# Patient Record
Sex: Female | Born: 1960 | Race: White | Hispanic: No | Marital: Married | Smoking: Former smoker
Health system: Southern US, Community
[De-identification: ages and names within clinical notes are randomized; demographics above are authoritative.]

## PROBLEM LIST (undated history)

## (undated) DIAGNOSIS — E7800 Pure hypercholesterolemia, unspecified: Secondary | ICD-10-CM

## (undated) HISTORY — PX: OTHER SURGICAL HISTORY: SHX169

---

## 2000-04-13 ENCOUNTER — Encounter: Payer: Self-pay | Admitting: Family Medicine

## 2000-05-24 ENCOUNTER — Encounter: Payer: Self-pay | Admitting: Family Medicine

## 2000-07-20 ENCOUNTER — Encounter: Payer: Self-pay | Admitting: Family Medicine

## 2001-05-21 ENCOUNTER — Encounter: Payer: Self-pay | Admitting: Family Medicine

## 2004-02-17 ENCOUNTER — Encounter: Payer: Self-pay | Admitting: Family Medicine

## 2004-06-25 ENCOUNTER — Encounter: Payer: Self-pay | Admitting: Family Medicine

## 2004-10-14 ENCOUNTER — Encounter: Payer: Self-pay | Admitting: Family Medicine

## 2004-10-26 ENCOUNTER — Encounter: Payer: Self-pay | Admitting: Family Medicine

## 2005-01-07 ENCOUNTER — Encounter: Payer: Self-pay | Admitting: Family Medicine

## 2005-05-29 ENCOUNTER — Encounter: Payer: Self-pay | Admitting: Family Medicine

## 2005-06-07 ENCOUNTER — Encounter: Payer: Self-pay | Admitting: Family Medicine

## 2007-10-08 ENCOUNTER — Ambulatory Visit: Payer: Self-pay | Admitting: Family Medicine

## 2007-10-08 DIAGNOSIS — R079 Chest pain, unspecified: Secondary | ICD-10-CM | POA: Insufficient documentation

## 2007-10-08 DIAGNOSIS — K219 Gastro-esophageal reflux disease without esophagitis: Secondary | ICD-10-CM | POA: Insufficient documentation

## 2007-10-15 ENCOUNTER — Encounter: Payer: Self-pay | Admitting: Family Medicine

## 2007-10-16 LAB — CONVERTED CEMR LAB
Albumin: 4.2 g/dL (ref 3.5–5.2)
CO2: 22 meq/L (ref 19–32)
Cholesterol, target level: 200 mg/dL
Cholesterol: 204 mg/dL — ABNORMAL HIGH (ref 0–200)
Glucose, Bld: 86 mg/dL (ref 70–99)
LDL Goal: 160 mg/dL
Potassium: 4 meq/L (ref 3.5–5.3)
Sodium: 141 meq/L (ref 135–145)
Total Protein: 6.3 g/dL (ref 6.0–8.3)
Triglycerides: 195 mg/dL — ABNORMAL HIGH (ref <150)

## 2007-11-20 ENCOUNTER — Encounter: Payer: Self-pay | Admitting: Family Medicine

## 2007-12-26 ENCOUNTER — Encounter: Payer: Self-pay | Admitting: Family Medicine

## 2008-06-18 ENCOUNTER — Ambulatory Visit: Payer: Self-pay | Admitting: Family Medicine

## 2008-06-18 LAB — CONVERTED CEMR LAB
Glucose, Urine, Semiquant: NEGATIVE
Ketones, urine, test strip: NEGATIVE
Urobilinogen, UA: 0.2

## 2008-06-19 ENCOUNTER — Encounter: Payer: Self-pay | Admitting: Family Medicine

## 2009-03-13 ENCOUNTER — Ambulatory Visit: Payer: Self-pay | Admitting: Family Medicine

## 2009-04-13 ENCOUNTER — Ambulatory Visit: Payer: Self-pay | Admitting: Family Medicine

## 2009-04-13 DIAGNOSIS — E041 Nontoxic single thyroid nodule: Secondary | ICD-10-CM | POA: Insufficient documentation

## 2009-04-14 ENCOUNTER — Encounter: Admission: RE | Admit: 2009-04-14 | Discharge: 2009-04-14 | Payer: Self-pay | Admitting: Family Medicine

## 2009-04-14 LAB — CONVERTED CEMR LAB
Basophils Absolute: 0 10*3/uL (ref 0.0–0.1)
Free T4: 1.25 ng/dL (ref 0.80–1.80)
Hemoglobin: 14.9 g/dL (ref 12.0–15.0)
Lymphocytes Relative: 26 % (ref 12–46)
Monocytes Absolute: 0.4 10*3/uL (ref 0.1–1.0)
Monocytes Relative: 7 % (ref 3–12)
Neutro Abs: 4.2 10*3/uL (ref 1.7–7.7)
RBC: 4.78 M/uL (ref 3.87–5.11)
RDW: 13.2 % (ref 11.5–15.5)
TSH: 0.539 microintl units/mL (ref 0.350–4.500)

## 2009-04-30 ENCOUNTER — Encounter: Payer: Self-pay | Admitting: Family Medicine

## 2009-05-11 ENCOUNTER — Telehealth: Payer: Self-pay | Admitting: Family Medicine

## 2009-05-13 ENCOUNTER — Ambulatory Visit: Payer: Self-pay | Admitting: Family Medicine

## 2009-05-13 DIAGNOSIS — L03319 Cellulitis of trunk, unspecified: Secondary | ICD-10-CM

## 2009-05-13 DIAGNOSIS — L02219 Cutaneous abscess of trunk, unspecified: Secondary | ICD-10-CM | POA: Insufficient documentation

## 2009-07-09 ENCOUNTER — Telehealth: Payer: Self-pay | Admitting: Family Medicine

## 2009-08-13 ENCOUNTER — Telehealth (INDEPENDENT_AMBULATORY_CARE_PROVIDER_SITE_OTHER): Payer: Self-pay | Admitting: *Deleted

## 2009-10-01 ENCOUNTER — Ambulatory Visit: Payer: Self-pay | Admitting: Family Medicine

## 2009-10-01 DIAGNOSIS — R5381 Other malaise: Secondary | ICD-10-CM | POA: Insufficient documentation

## 2009-10-01 DIAGNOSIS — N390 Urinary tract infection, site not specified: Secondary | ICD-10-CM | POA: Insufficient documentation

## 2009-10-01 DIAGNOSIS — F3289 Other specified depressive episodes: Secondary | ICD-10-CM | POA: Insufficient documentation

## 2009-10-01 DIAGNOSIS — R5383 Other fatigue: Secondary | ICD-10-CM

## 2009-10-01 DIAGNOSIS — F329 Major depressive disorder, single episode, unspecified: Secondary | ICD-10-CM | POA: Insufficient documentation

## 2009-10-01 DIAGNOSIS — F320 Major depressive disorder, single episode, mild: Secondary | ICD-10-CM | POA: Insufficient documentation

## 2009-10-01 LAB — CONVERTED CEMR LAB
Specific Gravity, Urine: >=1.03
Urobilinogen, UA: 0.2
pH: 6

## 2009-10-04 LAB — CONVERTED CEMR LAB
BUN: 12 mg/dL (ref 6–23)
CO2: 24 meq/L (ref 19–32)
Calcium: 9.1 mg/dL (ref 8.4–10.5)
Chloride: 106 meq/L (ref 96–112)
Creatinine, Ser: 0.75 mg/dL (ref 0.40–1.20)
Glucose, Bld: 82 mg/dL (ref 70–99)
HCT: 44.7 % (ref 36.0–46.0)
Hemoglobin: 15.4 g/dL — ABNORMAL HIGH (ref 12.0–15.0)
Iron: 129 ug/dL (ref 42–145)
MCV: 92.4 fL (ref 78.0–100.0)
RBC: 4.84 M/uL (ref 3.87–5.11)
TSH: 0.725 microintl units/mL (ref 0.350–4.500)
Total Bilirubin: 0.4 mg/dL (ref 0.3–1.2)
Vit D, 25-Hydroxy: 53 ng/mL (ref 30–89)
Vitamin B-12: 365 pg/mL (ref 211–911)
WBC: 7 10*3/uL (ref 4.0–10.5)

## 2009-10-12 ENCOUNTER — Encounter: Admission: RE | Admit: 2009-10-12 | Discharge: 2009-10-12 | Payer: Self-pay | Admitting: Family Medicine

## 2010-02-02 NOTE — Letter (Signed)
Summary: Depression Questionnaire  Depression Questionnaire   Imported By: Lanelle Bal 10/08/2009 09:26:32  _____________________________________________________________________  External Attachment:    Type:   Image     Comment:   External Document

## 2010-02-02 NOTE — Progress Notes (Signed)
Summary: Results  Phone Note Call from Patient Call back at Work Phone [phone]   Caller: Patient Call For: Nani Gasser MD Summary of Call: Wanting biopsy results Initial call taken by: Kathlene November,  May 11, 2009 12:42 PM  Follow-up for Phone Call        Called and [facility] that results are benign bur that I did have a question for the pathologist and he is supposed to call me back later today to clarify something.  Follow-up by: Nani Gasser MD,  May 11, 2009 12:57 PM

## 2010-02-02 NOTE — Progress Notes (Signed)
----   Converted from flag ---- ---- 08/13/2009 11:54 AM, Nani Gasser MD wrote: Call pt:Due for f/u US of thyroid. ------------------------------  1:11 08/13/09 pt has been notified

## 2010-02-02 NOTE — Assessment & Plan Note (Signed)
Summary: sinusitis   Vital Signs:  Patient profile:   50 year old female Height:      63.4 inches Weight:      144 pounds Temp:     98.6 degrees F oral Pulse rate:   65 / minute BP sitting:   97 / 59  (left arm) Cuff size:   regular  Vitals Entered By: Kaitlyn Knox (March 13, 2009 10:37 AM) CC: sinus drainage, dull H/A, left earache, congestion for 2 weeks now- been taking Sudafed with not much relief   Primary Care Provider:  Nani Gasser MD  CC:  sinus drainage, dull H/A, left earache, and congestion for 2 weeks now- been taking Sudafed with not much relief.  History of Present Illness: Kaitlyn Knox is a 50 year old woman here with sinus drainage, HA, ear pain, and congestion x10 days. She has had a mild cough and some sinus pressure (worse over teh left maxillary sxs). She has been taking Sudafed without much relief. Her ear pain has been waking her up at night. Advil has not helped. No fevers, chills, mild sore throat which has resolved, no muscle aches. Many of her coworkers have been sick with the flu.   Current Medications (verified): 1)  Femhrt Low Dose 0.5-2.5 Mg-Mcg Tabs (Norethindrone-Eth Estradiol) .... Take One Tablet By Mouth Once A Day 2)  Benadryl 25 Mg Caps (Diphenhydramine Hcl) .... Take One Capsule By Mouth At Bedtime  Allergies (verified): 1)  ! Pcn 2)  ! Cipro 3)  ! Bactrim  Comments:  Nurse/Medical Assistant: The patient's medications and allergies were reviewed with the patient and were updated in the Medication and Allergy Lists. Kaitlyn Knox (March 13, 2009 10:38 AM)  Physical Exam  General:  Pleasant well-appearing female in no acute distress.  Head:  Normocephalic and atraumatic without obvious abnormalities. No apparent alopecia or balding. Eyes:  PERRL. No corneal or conjunctival inflammation noted.  Ears:  Left TM slightly retracted with light reflex not visualized. No erythema or inflammation. Right TM normal with normal light reflex.    Nose:  Nasal turbinates slightly red. L maxillary tenderness to percussion.  Mouth:  Oropharynx clear with no lesions or exudate.  Neck:  No deformities, masses, or tenderness noted. Lungs:  Clear to auscultation bilaterally with no wheezes, rales, or rhonchi. Normal work of breathing.  Heart:  Regular rate and rhythm with normal S1 and S2. No murmur, rub, or gallop.  Pulses:  Radial pulses 2+ bilaterally.  Extremities:  No cyanosis, clubbing, or edema.  Skin:  no rashes.   Cervical Nodes:  No lymphadenopathy noted Psych:  Interacting appropriately with normal concentration and attention. Not anxious or depressed appearing.    Impression & Recommendations:  Problem # 1:  SINUSITIS - ACUTE-NOS (ICD-461.9) Likely viral URI that progressed over 10 days to acute bacterial sinusitis L>R. Pt allergic to penicillin, will treat with azythromycin. Follow up in one week if no improvement.   The following medications were removed from the medication list:    Cephalexin 500 Mg Caps (Cephalexin) .Marland Kitchen... Take 1 tablet by mouth two times a day for 5 days Her updated medication list for this problem includes:    Zithromax Z-pak 250 Mg Tabs (Azithromycin) .Marland Kitchen... Take as directed  Complete Medication List: 1)  Femhrt Low Dose 0.5-2.5 Mg-mcg Tabs (Norethindrone-eth estradiol) .... Take one tablet by mouth once a day 2)  Benadryl 25 Mg Caps (Diphenhydramine hcl) .... Take one capsule by mouth at bedtime 3)  Zithromax Z-pak 250 Mg  Tabs (Azithromycin) .... Take as directed Prescriptions: ZITHROMAX Z-PAK 250 MG TABS (AZITHROMYCIN) Take as directed  #1 pack x 0   Entered and Authorized by:   Nani Gasser MD   Signed by:   Nani Gasser MD on 03/13/2009   Method used:   Electronically to        [facility] [phone]* (retail)       [address]       [place], [place]  [address]       Ph: [phone]       Fax: [phone]   RxID:   [id number]

## 2010-02-02 NOTE — Assessment & Plan Note (Signed)
Summary: Back Pain, UTI, fatigue, etc   Vital Signs:  Patient profile:   50 year old female Height:      63.4 inches Weight:      144 pounds Pulse rate:   71 / minute BP sitting:   111 / 66  (right arm) Cuff size:   regular  Vitals Entered By: Avon Gully CMA, Duncan Dull) (October 01, 2009 8:15 AM) CC: lower back pain,chills hot flashes, concerned about UTI   Primary Care Feige Lowdermilk:  Nani Gasser MD  CC:  lower back pain, chills hot flashes, and concerned about UTI.  History of Present Illness: lower back pain,chills hot flashes, concerned about UTI.  has been under alot of stress since April.  Has had some back pain on and off since then. Mostly bilat low back and upper back between teh shoulder blades. 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   Has been using s TENS unit adn taht does help.  Has been to a chiropracter in the past. Does have some exercises at home but hasn't been doing them.  Taking OTC pain relievers occ.  Left low back pain started yesterday radiating around the the pelvis.  + dysuria.  No energy.  Doesn't want to do anything. Feeling down too.  Increased appetitie over the last 3 months.   Tiime ot repeat test for [place] as well. .    Current Medications (verified): 1)  Femhrt Low Dose 0.5-2.5 Mg-Mcg Tabs (Norethindrone-Eth Estradiol) .... Take One Tablet By Mouth Once A Day 2)  Benadryl 25 Mg Caps (Diphenhydramine Hcl) .... Take One Capsule By Mouth At Bedtime  Allergies (verified): 1)  ! Pcn 2)  ! Cipro 3)  ! Bactrim  Comments:  Nurse/Medical Assistant: The patient's medications and allergies were reviewed with the patient and were updated in the Medication and Allergy Lists. 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  Divorced Current Smoker Alcohol use-yes Drug use-no Regular exercise-no  Physical Exam  General:  Well-developed,well-nourished,in no acute distress; alert,appropriate and cooperative throughout examination Msk:  Normal flexion, extension, rotation right and left.  Nontender lumbar or thoracic spine.  Nontender paraspinous muscles and SI joint tenderness.  Hip, knee and ankle strength 5/5  Neurologic:  patellar and achilles reflexes 2+ bilat.  Skin:  no rashes.   Psych:  Cognition and judgment appear intact. Alert and cooperative with normal attention span and concentration. No apparent delusions, illusions, hallucinations   Impression & Recommendations:  Problem # 1:  UTI (ICD-599.0)  The following medications were removed from the medication list:    Doxycycline Hyclate 100 Mg Caps (Doxycycline hyclate) .Marland Kitchen... Take 1 tablet by mouth two times a day for 10 days Her updated medication list for this problem includes:    Keflex 500 Mg Caps (Cephalexin) .Marland Kitchen... Take 1 tablet by mouth two times a day for 3 days  Encouraged to push clear liquids, get enough rest, and take acetaminophen as needed. To be seen in 10 days if no improvement, sooner if worse.  Orders: T-Urine Culture (Spectrum Order) [id number]) UA Dipstick w/o Micro (automated)  (81003)  Problem # 2:  LUMBAGO (ICD-724.2) Work on exercises to stretch out the back. Avoid sitting for prolonged periods. Get up and stretch. Will send rx for a muscle  relaxer.  The following medications were removed from the medication list:    Hydrocodone-acetaminophen 5-500 Mg Tabs (Hydrocodone-acetaminophen) .Marland Kitchen... 1-2 tabs by mouth every 4-6 hours as needed. Her updated medication list for this problem includes:    Cyclobenzaprine Hcl 10 Mg Tabs (Cyclobenzaprine hcl) .Marland Kitchen... Take 1 tablet by mouth once a day at bedtime as needed  Problem # 3:  DEPRESSION, MILD (ICD-311) PHQ-9 socre of 9 (mild).  Dsicussed will rule out anmeia, etc that may be causing some of her sxs and the f/u in a ocuple of weeks to discuss tx optoins for mood.   Problem # 4:  THYROID NODULE (ICD-241.0)  Due to schedule for repaet [place].    Orders: T-*Unlisted Diagnostic X-ray test/procedure (40981)  Complete Medication List: 1)  Femhrt Low Dose 0.5-2.5 Mg-mcg Tabs (Norethindrone-eth estradiol) .... Take one tablet by mouth once a day 2)  Benadryl 25 Mg Caps (Diphenhydramine hcl) .... Take one capsule by mouth at bedtime 3)  Keflex 500 Mg Caps (Cephalexin) .... Take 1 tablet by mouth two times a day for 3 days 4)  Cyclobenzaprine Hcl 10 Mg Tabs (Cyclobenzaprine hcl) .... Take 1 tablet by mouth once a day at bedtime as needed  Other Orders: T-Comprehensive Metabolic Panel [id number]) T-TSH [id number]) T-CBC No Diff ([id number]) T-Iron (32440-10272) T-Iron Binding Capacity (TIBC) ([address]) T-Folic Acid, Serum [phone]) T-Vitamin D (25-Hydroxy) (56433-29518) T-Vitamin B12 (84166-06301)   Patient Instructions: 1)  Work on exercises to stretch out the back. 2)  Avoid sitting for prolonged periods. Get up and stretch. 3)  Will send rx for a muscle relaxer to your pharmacy. 4)  Complete the antibiotic for your UTI 5)  We will call you with your lab results.  6)  Follow up in 1-2 weeks for mood.  Prescriptions: CYCLOBENZAPRINE HCL 10 MG TABS (CYCLOBENZAPRINE HCL) Take 1 tablet by mouth once a day at bedtime as needed  #30 x 0   Entered and Authorized by:   Nani Gasser MD   Signed by:   Nani Gasser MD on 10/01/2009   Method used:   Electronically to        [facility]  [address] [facility] [phone]* (retail)       [address] [place], [place]  [address]       Ph: [phone] or [phone]       Fax: [phone]   RxID:   [id number] KEFLEX 500 MG CAPS (CEPHALEXIN) Take 1 tablet by mouth two times a day for 3 days  #6 x 0   Entered and Authorized by:   Nani Gasser MD    Signed by:   Nani Gasser MD on 10/01/2009   Method used:   Electronically to        [facility]  [address] [facility] [phone]* (retail)       [address] [place], [place]  [address]       Ph: [phone] or [phone]       Fax: [phone]   RxID:   [id number]   Laboratory Results   Urine Tests  Date/Time Received: 10/01/09 Date/Time Reported: 10/01/09  Routine Urinalysis   Color: yellow Appearance: Clear Glucose: negative   (Normal Range: Negative) Bilirubin: negative   (Normal Range: Negative) Ketone: negative   (Normal Range: Negative) Spec. Gravity: >=1.030   (Normal Range: 1.003-1.035) Blood: small   (Normal Range:  Negative) pH: 6.0   (Normal Range: 5.0-8.0) Protein: trace   (Normal Range: Negative) Urobilinogen: 0.2   (Normal Range: 0-1) Nitrite: negative   (Normal Range: Negative) Leukocyte Esterace: moderate   (Normal Range: Negative)        Appended Document: Back Pain, UTI, fatigue, etc No fever or nausea or vomiting.  No change in bowels.

## 2010-02-02 NOTE — Progress Notes (Signed)
Summary: biopsy questions  Phone Note Call from Patient Call back at Work Phone [phone]   Caller: Patient Call For: Nani Gasser MD Summary of Call: Pt says there was some confusion while there to get biopsy that the nodule wasn't really on the thyroid but above it and she said that the pathologist told her that he was doing a biopsy on the others that were seen so she ias questioning whether she needs another one - what to do with this one- leave it alone or take it out. Has multiple questions after you talk to pathologists Initial call taken by: Kathlene November,  May 11, 2009 2:41 PM  Follow-up for Phone Call        [facility] at work.  Follow-up by: Nani Gasser MD,  May 11, 2009 4:58 PM  Additional Follow-up for Phone Call Additional follow up Details #1::        LMOM at work and when called home phone told it was incorrect number.  Additional Follow-up by: Nani Gasser MD,  May 12, 2009 8:02 AM    Additional Follow-up for Phone Call Additional follow up Details #2::    Dsicussed at OV today.  Follow-up by: Nani Gasser MD,  May 13, 2009 12:30 PM

## 2010-02-02 NOTE — Progress Notes (Signed)
Summary: med request  Phone Note Refill Request Call back at Work Phone [phone] Message from:  Patient on July 09, 2009 9:53 AM  Pt is requesting a Muscle Relaxer called Flexeril??? She has been taking this for her back from a previous Dr. she was seeing.... Call back pt. with any questions at work.Michaelle Copas  July 09, 2009 9:56 AM   Initial call taken by: Michaelle Copas,  July 09, 2009 9:56 AM  Follow-up for Phone Call        Called Pt to schedule OV bc she has not been seen for back pain nor gotten flexeril from here. Pt states MRI is in her chart for review, she can not afford an OV. Pt states back rarely hurts but when it does she takes flexeril and her flexeril has expired. Please advise. Follow-up by: Payton Spark CMA,  July 09, 2009 10:22 AM  Additional Follow-up for Phone Call Additional follow up Details #1::        I reviewed her MRI from 06 and the last note from Dr Alvester Morin.  She has very mild Degenerative disc dz and it was recommended to take Naproxen (aleve).  She can take this OTC (1 tab twice a day with food).  Will need OV if she wants RX meds. Additional Follow-up by: Seymour Bars DO,  July 09, 2009 10:30 AM     Appended Document: med request Pt aware of the above. Pt very upset that her messages were never returned before now.

## 2010-02-02 NOTE — Assessment & Plan Note (Signed)
Summary: Nodule on her neck.    Vital Signs:  Patient profile:   50 year old female Height:      63.4 inches Weight:      141 pounds BMI:     24.75 Pulse rate:   66 / minute BP sitting:   108 / 67  (left arm) Cuff size:   regular  Vitals Entered By: Kathlene November (April 13, 2009 8:42 AM) CC: knot on throat- noticed last Wednesday- tender when touches it   Primary Care Provider:  Nani Gasser MD  CC:  knot on throat- noticed last Wednesday- tender when touches it.  History of Present Illness: knot on throat- noticed last Wednesday (4-5 days ago)- tender when touches it.  No dyaphagia. Started with some nasal drainage rom her allergies.  No URI. Beandryl in the evening. No fever.  Has lost 3 pounds even thought has been eating very poorly and has been very stressed at work.    Current Medications (verified): 1)  Femhrt Low Dose 0.5-2.5 Mg-Mcg Tabs (Norethindrone-Eth Estradiol) .... Take One Tablet By Mouth Once A Day 2)  Benadryl 25 Mg Caps (Diphenhydramine Hcl) .... Take One Capsule By Mouth At Bedtime  Allergies (verified): 1)  ! Pcn 2)  ! Cipro 3)  ! Bactrim  Comments:  Nurse/Medical Assistant: The patient's medications and allergies were reviewed with the patient and were updated in the Medication and Allergy Lists. Kathlene November (April 13, 2009 8:43 AM)  Physical Exam  General:  Well-developed,well-nourished,in no acute distress; alert,appropriate and cooperative throughout examination Neck:  supple and full ROM.  Small firm nodule about 1 cm along mid anterior neck, just slightly right to the midline.    Impression & Recommendations:  Problem # 1:  NECK PAIN (ICD-723.1) Discussed possible dx of LN vs cyst on the thyroid gland. By feel of the lesion suspect it is a cyst, and it is slightly right of miidliine. Will check CBC and thyroid levels  today.  Also will schedule for thyroid US to further evaluate the cyst. Lesion is really only tender when she rubs on it  so recommen avoid rubbing the area. No fever or signs of thyroiditis.  Orders: T-TSH [phone]) T-T3, Free [phone]) T-T4, Free [id number]) T-CBC w/Diff [id number]) T-*Unlisted Diagnostic X-ray test/procedure (19509)  Complete Medication List: 1)  Femhrt Low Dose 0.5-2.5 Mg-mcg Tabs (Norethindrone-eth estradiol) .... Take one tablet by mouth once a day 2)  Benadryl 25 Mg Caps (Diphenhydramine hcl) .... Take one capsule by mouth at bedtime

## 2010-02-02 NOTE — Assessment & Plan Note (Signed)
Summary: Abscess, f/u thyroid   Vital Signs:  Patient profile:   50 year old female Height:      63.4 inches Weight:      143 pounds Temp:     98.3 degrees F oral Pulse rate:   80 / minute BP sitting:   117 / 71  (left arm) Cuff size:   regular  Vitals Entered By: Kathlene November (May 13, 2009 11:04 AM) CC: boil on left side vaginal area- painful and red   Primary Care Provider:  Nani Gasser MD  CC:  boil on left side vaginal area- painful and red.  History of Present Illness: Hx of sebaceous cyst for years over the pubis symphisis. Was squeezing it Sunday night  (3 days ago) and it was flat. Then yesterday woke up nad it was very tender, red and hot. No active drainage.    Current Medications (verified): 1)  Femhrt Low Dose 0.5-2.5 Mg-Mcg Tabs (Norethindrone-Eth Estradiol) .... Take One Tablet By Mouth Once A Day 2)  Benadryl 25 Mg Caps (Diphenhydramine Hcl) .... Take One Capsule By Mouth At Bedtime  Allergies (verified): 1)  ! Pcn 2)  ! Cipro 3)  ! Bactrim  Comments:  Nurse/Medical Assistant: The patient's medications and allergies were reviewed with the patient and were updated in the Medication and Allergy Lists. Kathlene November (May 13, 2009 11:05 AM)  Physical Exam  General:  Well-developed,well-nourished,in no acute distress; alert,appropriate and cooperative throughout examination Skin:  Approx 4 cm lesion just alont the superior edge of the pubis symphysis.  VEry tender with appor 3 cm surrounding erythema. No active drainage.    Impression & Recommendations:  Problem # 1:  ABSCESS, GROIN (ICD-682.2)  LIkely originaly a sebaceous cyst that is now infected. Will tx with an antibiotic as well since has cellulitis.  Discused to remove 1 inch of packing every day and keep wound covered. Keep dry in the shower.  No alcohol or peroxide to the wound. Call if fever or getting worse.    Her updated medication list for this problem includes:    Doxycycline Hyclate  100 Mg Caps (Doxycycline hyclate) .Marland Kitchen... Take 1 tablet by mouth two times a day for 10 days  Orders: I&D Abscess, Simple / Single (10060)  Problem # 2:  THYROID NODULE (ICD-241.0) Reviewed bx was benign.  F/U US in 3-4 months.   Complete Medication List: 1)  Femhrt Low Dose 0.5-2.5 Mg-mcg Tabs (Norethindrone-eth estradiol) .... Take one tablet by mouth once a day 2)  Benadryl 25 Mg Caps (Diphenhydramine hcl) .... Take one capsule by mouth at bedtime 3)  Doxycycline Hyclate 100 Mg Caps (Doxycycline hyclate) .... Take 1 tablet by mouth two times a day for 10 days 4)  Hydrocodone-acetaminophen 5-500 Mg Tabs (Hydrocodone-acetaminophen) .Marland Kitchen.. 1-2 tabs by mouth every 4-6 hours as needed.  Patient Instructions: 1)  No alcohol or peroxide on the wound.  Can clear around it with regular antibacterial soap and water.  2)  Complete all the antibiotic 3)  Removed about 1 inch of packing a day and keep covered. Can change the dressing once or twice a day.   4)  Call if gets any fever.    Procedure Note  Cyst Removal: The patient complains of pain, redness, inflammation, tenderness, and swelling but denies discharge and fever.  Procedure #1: I&D     Size (in cm): 4.o x 5.o    Region: suprapubic area.     Comment: Lidocaine w/ epi L# [id number]  Exp. 11/2010    Instrument used: #11 blade    Anesthesia: 1% lidocaine w/epinephrine  Cleaned and prepped with: alcohol and betadine Wound dressing: pressure dressing Additional Instructions: Approx 12 inches of packing placed into the wound.   Incision & Drainage:    Region: palmar  Prescriptions: HYDROCODONE-ACETAMINOPHEN 5-500 MG TABS (HYDROCODONE-ACETAMINOPHEN) 1-2 tabs by mouth every 4-6 hours as needed.  #8 x 0   Entered and Authorized by:   Nani Gasser MD   Signed by:   Nani Gasser MD on 05/13/2009   Method used:   Printed then faxed to ...       [facility] [phone]* (retail)       [address] [place], [place]   [address]       Ph: [phone]       Fax: [phone]   RxID:   [id number] DOXYCYCLINE HYCLATE 100 MG CAPS (DOXYCYCLINE HYCLATE) Take 1 tablet by mouth two times a day for 10 days  #20 x 0   Entered and Authorized by:   Nani Gasser MD   Signed by:   Nani Gasser MD on 05/13/2009   Method used:   Electronically to        [facility] [phone]* (retail)       [address]       [place], [place]  [address]       Ph: [phone]       Fax: [phone]   RxID:   [id number]

## 2010-03-08 ENCOUNTER — Telehealth: Payer: Self-pay | Admitting: Family Medicine

## 2010-03-16 NOTE — Progress Notes (Signed)
Summary: Suggest OTC cold med  Phone Note Call from Patient   Caller: Patient Summary of Call: Pt LMOM stating she has a cold and would like suggestion on OTC med for head congestion and ear pressure. Please advise. Initial call taken by: Payton Spark CMA,  March 08, 2010 10:54 AM  Follow-up for Phone Call        Can use Dayquail or  Alkeseltzer Cold Follow-up by: Nani Gasser MD,  March 08, 2010 11:07 AM  Additional Follow-up for Phone Call Additional follow up Details #1::        left message on pt vm Additional Follow-up by: Avon Gully CMA, Duncan Dull),  March 10, 2010 1:18 PM

## 2010-05-28 ENCOUNTER — Telehealth: Payer: Self-pay | Admitting: Family Medicine

## 2010-05-28 NOTE — Telephone Encounter (Signed)
Pt notified that she can come in and we can do a culture but having a rash multi places usually is not shingles.  Pt after she was contacted back doesn't really feel it is the shingles after talking to several people.  Her rash is not cofined to the nerve route.   Plan:  Pt wishes to hold off on a office visit and see what the rash does.  Told pt to do symptomatic treatment but if clearly going on next week will need to be seen because rashes can be tricky.  Pt voiced understanding. Jarvis Newcomer, LPN Domingo Dimes

## 2010-05-28 NOTE — Telephone Encounter (Signed)
Pt called and is not sure if she possibly has the shingles.  C/O bumps that are itchy, burning, with assoc rash on different places of the body. Plan;  Pt call was returned and had to [facility] for the pt and gave some general instructions regarding having shingles and told the pt to call the triage nurse back so a appt could be determined. Jarvis Newcomer, LPN Domingo Dimes

## 2010-05-28 NOTE — Telephone Encounter (Signed)
She can come in and we can do a culture to confirm but if multiple places may not be shingles.

## 2010-11-03 ENCOUNTER — Telehealth: Payer: Self-pay | Admitting: Family Medicine

## 2010-11-03 ENCOUNTER — Other Ambulatory Visit: Payer: Self-pay | Admitting: Family Medicine

## 2010-11-03 MED ORDER — DIPHENOXYLATE-ATROPINE 2.5-0.025 MG PO TABS: 1.0000 | ORAL_TABLET | Freq: Four times a day (QID) | ORAL | Status: AC | PRN

## 2010-11-03 NOTE — Telephone Encounter (Signed)
I entered med. Okay to send.

## 2010-11-03 NOTE — Telephone Encounter (Signed)
Pt called and needs a RX for lomotil.  We have never prescribed this medication for the pt before.  SHe last received this med back in 2007 from another doctor.  York Spaniel it works really well for stomach issues, and she doesn't have to use often, and she just ran out of script she was given in 2007.  Wants sent to [facility] [place]. Plan: Routed this encounter to the provider since we have never prescribed this medication before. Jarvis Newcomer, LPN Domingo Dimes

## 2010-11-03 NOTE — Telephone Encounter (Signed)
Pt informed by [facility] that her script for Lomotil was sent electronically to her [facility] in [place]. Jarvis Newcomer, LPN Domingo Dimes

## 2010-11-03 NOTE — Telephone Encounter (Signed)
Medication Lomotil was sent to [facility] [place].  Could not do it by sure scripts interface because the system is having problems today that have been reported to the help desk. Jarvis Newcomer, LPN Domingo Dimes

## 2010-12-02 ENCOUNTER — Telehealth: Payer: Self-pay | Admitting: *Deleted

## 2010-12-02 MED ORDER — VARENICLINE TARTRATE 0.5 MG X 11 & 1 MG X 42 PO MISC: ORAL | Status: DC

## 2010-12-02 NOTE — Telephone Encounter (Signed)
Pt calls and would like Chantix to quit smoking. [facility] [place]

## 2010-12-02 NOTE — Telephone Encounter (Signed)
Rx sent. Call in one month fo the continuing pack.

## 2010-12-04 HISTORY — PX: OTHER SURGICAL HISTORY: SHX169

## 2011-02-11 ENCOUNTER — Encounter: Payer: Self-pay | Admitting: Physician Assistant

## 2011-02-11 ENCOUNTER — Ambulatory Visit (INDEPENDENT_AMBULATORY_CARE_PROVIDER_SITE_OTHER): Payer: BC Managed Care – PPO | Admitting: Physician Assistant

## 2011-02-11 VITALS — BP 118/72 | HR 63 | Temp 98.4°F | Ht 63.0 in | Wt 129.0 lb

## 2011-02-11 DIAGNOSIS — R5383 Other fatigue: Secondary | ICD-10-CM

## 2011-02-11 DIAGNOSIS — Z0000 Encounter for general adult medical examination without abnormal findings: Secondary | ICD-10-CM

## 2011-02-11 NOTE — Patient Instructions (Addendum)
  Encouraged daily exercise and a good healthy diet with fruits and vegetables. Calcium 500mg  twice a day or 4 servings of dairy for good bone health.  Discussed smoking cessation after resolution of infection from surgeries. Schedule Colonoscopy for sometime this year.  Yearly mammogram with pelvic in May with OB.  Consider Tdap booster this year when ready.  Get labs drawn and will call with results.   Thank you for enrolling in MyChart. Please follow the instructions below to securely access your online medical record. MyChart allows you to send messages to your doctor, view your test results, manage appointments, and more.   How Do I Sign Up? 1. In your Internet browser, go to [email or web address]. 2. Click on the Sign Up Now link in the Sign In box. You will see the New Member Sign Up page. 3. Enter your MyChart Access Code exactly as it appears below. You will not need to use this code after you've completed the sign-up process. If you do not sign up before the expiration date, you must request a new code. MyChart Access Code: [email or web address] Expires: 03/13/2011 11:44 AM  4. Enter your Social Security Number ([id number]) and Date of Birth (mm/dd/yyyy) as indicated and click Submit. You will be taken to the next sign-up page. 5. Create a MyChart ID. This will be your MyChart login ID and cannot be changed, so think of one that is secure and easy to remember. 6. Create a MyChart password. You can change your password at any time. 7. Enter your Password Reset Question and Answer. This can be used at a later time if you forget your password.  8. Enter your e-mail address. You will receive e-mail notification when new information is available in MyChart. 9. Click Sign Up. You can now view your medical record.   Additional Information If you have questions, you can call [phone]) to talk to our MyChart staff. Remember, MyChart is NOT to be used for urgent needs.  For medical emergencies, dial 911.

## 2011-02-11 NOTE — Progress Notes (Signed)
Subjective:     Kaitlyn Knox is a 51 y.o. female and is here for a comprehensive physical exam. The patient reports Her surgeon is managing her infection of the abdomen and bilateral legs. She does not complain of any other problems today. she request that some parts of physical exam not be performed today because of bandages and pain. She will go to [facility] in May for pap/mammogram.  She wants to follow-up on anemia due to blood loss. Last time it was checked it was 9.6. Patient still feels fatigued. She has a history of b12 deficiency.   History   Social History  . Marital Status: Divorced    Spouse Name: N/A    Number of Children: N/A  . Years of Education: N/A   Occupational History  . Not on file.   Social History Main Topics  . Smoking status: Current Everyday Smoker -- 1.0 packs/day    Types: Cigarettes  . Smokeless tobacco: Not on file  . Alcohol Use: Not on file  . Drug Use: Not on file  . Sexually Active: Not on file   Other Topics Concern  . Not on file   Social History Narrative  . No narrative on file   Health Maintenance  Topic Date Due  . Tetanus/tdap  05/16/1979  . Mammogram  05/16/2010  . Influenza Vaccine  10/04/2011  . Pap Smear  05/10/2013  . Colonoscopy  05/16/2010    The following portions of the patient's history were reviewed and updated as appropriate: allergies, current medications, past family history, past medical history, past social history, past surgical history and problem list.  Review of Systems Pertinent items are noted in HPI.   Objective:    BP 118/72  Pulse 63  Temp(Src) 98.4 F (36.9 C) (Oral)  Ht 5\' 3"  (1.6 m)  Wt 129 lb (58.514 kg)  BMI 22.85 kg/m2  SpO2 98% General appearance: alert, cooperative and appears stated age Head: Normocephalic, without obvious abnormality, atraumatic Eyes: conjunctivae/corneas clear. PERRL, EOM's intact. Fundi benign. Ears: normal TM's and external ear canals both ears Nose: Nares normal.  Septum midline. Mucosa normal. No drainage or sinus tenderness. Throat: lips, mucosa, and tongue normal; teeth and gums normal Neck: no adenopathy, no carotid bruit, no JVD, supple, symmetrical, trachea midline, thyroid: solitary nodule on the right side of neck right above thyroid. and thyroid not enlarged, symmetric, no tenderness/mass/nodules Back: symmetric, no curvature. ROM normal. No CVA tenderness. Lungs: clear to auscultation bilaterally Heart: regular rate and rhythm, S1, S2 normal, no murmur, click, rub or gallop and normal apical impulse Extremities: extremities normal, atraumatic, no cyanosis or edema Pulses: 2+ and symmetric Skin: Skin color, texture, turgor normal. No rashes or lesions Lymph nodes: Cervical, supraclavicular, and axillary nodes normal. Neurologic: Reflexes: 2+ and symmetric   Patient in body suit. Unable to evaluate her abdomen and tops of bilateral legs. Patient reports infection along the suture lines of tummy tuck and bilateral upper thighs. Assessment:    Healthy female exam.   Patient is still recovering from surgery in December 2012.    Plan:    V70.0-Get labs done today(CBC, CMP, TSH, Vit D, B12, lipid profile). Will call with results. Follow up in 1 year. Encouraged daily exercise and a good healthy diet with fruits and vegetables. Calcium 500mg  twice a day or 4 servings of dairy for good bone health.  Discussed smoking cessation after resolution of infection from surgeries. Reported Bone Density to have been done in the past  10 years from OB. Schedule Colonoscopy for sometime this year.  Yearly mammogram with pelvic in May with OB.  Consider Tdap booster this year when ready.  Get labs drawn and will call with results.  See After Visit Summary for Counseling Recommendations

## 2011-02-15 LAB — COMPREHENSIVE METABOLIC PANEL
ALT: 19 U/L (ref 0–35)
AST: 21 U/L (ref 0–37)
Alkaline Phosphatase: 60 U/L (ref 39–117)
BUN: 13 mg/dL (ref 6–23)
Calcium: 9.2 mg/dL (ref 8.4–10.5)
Chloride: 106 mEq/L (ref 96–112)
Creat: 0.63 mg/dL (ref 0.50–1.10)
Potassium: 4 mEq/L (ref 3.5–5.3)

## 2011-02-15 LAB — CBC WITH DIFFERENTIAL/PLATELET
Basophils Absolute: 0 10*3/uL (ref 0.0–0.1)
Basophils Relative: 1 % (ref 0–1)
Eosinophils Relative: 1 % (ref 0–5)
Lymphocytes Relative: 25 % (ref 12–46)
MCHC: 31.7 g/dL (ref 30.0–36.0)
Monocytes Absolute: 0.3 10*3/uL (ref 0.1–1.0)
Neutro Abs: 4.4 10*3/uL (ref 1.7–7.7)
Platelets: 255 10*3/uL (ref 150–400)
RDW: 15.6 % — ABNORMAL HIGH (ref 11.5–15.5)
WBC: 6.4 10*3/uL (ref 4.0–10.5)

## 2011-02-15 LAB — LIPID PANEL
HDL: 49 mg/dL (ref >39)
LDL Cholesterol: 146 mg/dL — ABNORMAL HIGH (ref 0–99)
Triglycerides: 177 mg/dL — ABNORMAL HIGH (ref <150)
VLDL: 35 mg/dL (ref 0–40)

## 2011-02-15 LAB — VITAMIN D 25 HYDROXY (VIT D DEFICIENCY, FRACTURES): Vit D, 25-Hydroxy: 47 ng/mL (ref 30–89)

## 2011-03-31 ENCOUNTER — Telehealth: Payer: Self-pay | Admitting: *Deleted

## 2011-03-31 MED ORDER — VARENICLINE TARTRATE 0.5 MG X 11 & 1 MG X 42 PO MISC: ORAL | Status: AC

## 2011-03-31 NOTE — Telephone Encounter (Signed)
Sent starter pack.Once almost complete call and we will send in the continuing pack for 2 months.

## 2011-03-31 NOTE — Telephone Encounter (Signed)
Pt calls and would like to stop smoking and would like the Chantix starter pack called into [facility] in [place]

## 2011-07-15 ENCOUNTER — Encounter: Payer: Self-pay | Admitting: *Deleted

## 2011-08-16 LAB — HM MAMMOGRAPHY: HM Mammogram: NEGATIVE

## 2011-08-31 ENCOUNTER — Telehealth: Payer: Self-pay | Admitting: Family Medicine

## 2011-08-31 ENCOUNTER — Encounter: Payer: Self-pay | Admitting: *Deleted

## 2011-08-31 DIAGNOSIS — M8580 Other specified disorders of bone density and structure, unspecified site: Secondary | ICD-10-CM

## 2011-08-31 NOTE — Telephone Encounter (Signed)
Call patient: Her to get her results of her bone density test. It does show that she has some osteopenia which is mildly thin bones. We recommend adequate calcium and vitamin D intake. Calcium should be 600 mg twice a day along with vitamin D total of 800 international units daily. Products like Caltrate D. can be a great source of this. Also she wants to work on getting divorced servings of dairy a day this usually provides adequate calcium intake. I would also like to check a vitamin D level on her. We can fax an order to the lab for this. Recheck bone density test in 2 years. Also encourage regular exercise.

## 2011-09-01 NOTE — Telephone Encounter (Signed)
Pt.notified

## 2011-12-06 ENCOUNTER — Encounter: Payer: Self-pay | Admitting: Family Medicine

## 2011-12-06 ENCOUNTER — Ambulatory Visit (INDEPENDENT_AMBULATORY_CARE_PROVIDER_SITE_OTHER): Payer: PRIVATE HEALTH INSURANCE | Admitting: Family Medicine

## 2011-12-06 VITALS — BP 107/66 | HR 70 | Temp 98.6°F | Ht 63.5 in | Wt 133.0 lb

## 2011-12-06 DIAGNOSIS — J329 Chronic sinusitis, unspecified: Secondary | ICD-10-CM

## 2011-12-06 MED ORDER — AZITHROMYCIN 250 MG PO TABS: ORAL_TABLET | ORAL | Status: DC

## 2011-12-06 NOTE — Patient Instructions (Addendum)

## 2011-12-06 NOTE — Progress Notes (Signed)
  Subjective:    Patient ID: Kaitlyn Knox, female    DOB: 09-13-1960, 52 y.o.   MRN: [id number]  HPI Sinus sxs x 1 week with sinus pain, tooth pain. Worse on the left.  Left ear hurts as well. Has been using benadryl at night. Took 3 Advil this AM, helps as well.  Her gum is swollen as well.  Says doesn't think it is a dental issue.  No fever.  + HA and nasal congestion. Used some mucinex some as well.    Review of Systems     Objective:   Physical Exam  Constitutional: She is oriented to person, place, and time. She appears well-developed and well-nourished.  HENT:  Head: Normocephalic and atraumatic.  Right Ear: External ear normal.  Left Ear: External ear normal.  Nose: Nose normal.  Mouth/Throat: Oropharynx is clear and moist.       TMs and canals are clear. She has some bilateral maxillary facial tenderness. The gums are swollen over the left upper jaw.  Eyes: Conjunctivae normal and EOM are normal. Pupils are equal, round, and reactive to light.  Neck: Neck supple. No thyromegaly present.  Cardiovascular: Normal rate, regular rhythm and normal heart sounds.   Pulmonary/Chest: Effort normal and breath sounds normal. She has no wheezes.  Lymphadenopathy:    She has no cervical adenopathy.  Neurological: She is alert and oriented to person, place, and time.  Skin: Skin is warm and dry.  Psychiatric: She has a normal mood and affect.          Assessment & Plan:  Sinusitis - will go ahead and treat with azithromycin since she has penicillin and sulfa allergies. Continue symptomatic care. Call if not significantly better in one week or starts like a fever. I also concerned about the swelling of the time. This could be a separate issue such as an abscess tooth. If the swelling around the gum is not improving then I recommend she see her dentist for further evaluation. It does appear, that she has a crown on the tooth.

## 2012-01-02 ENCOUNTER — Telehealth: Payer: Self-pay | Admitting: *Deleted

## 2012-04-02 ENCOUNTER — Telehealth: Payer: Self-pay | Admitting: *Deleted

## 2012-04-02 ENCOUNTER — Other Ambulatory Visit: Payer: Self-pay | Admitting: Family Medicine

## 2012-04-02 DIAGNOSIS — Z0000 Encounter for general adult medical examination without abnormal findings: Secondary | ICD-10-CM

## 2012-04-02 NOTE — Telephone Encounter (Signed)
Labs ordered.

## 2012-04-04 LAB — COMPREHENSIVE METABOLIC PANEL
BUN: 12 mg/dL (ref 6–23)
CO2: 27 mEq/L (ref 19–32)
Calcium: 9 mg/dL (ref 8.4–10.5)
Chloride: 106 mEq/L (ref 96–112)
Creat: 0.83 mg/dL (ref 0.50–1.10)
Glucose, Bld: 84 mg/dL (ref 70–99)
Total Bilirubin: 0.5 mg/dL (ref 0.3–1.2)

## 2012-04-04 LAB — LIPID PANEL
Cholesterol: 236 mg/dL — ABNORMAL HIGH (ref 0–200)
HDL: 66 mg/dL (ref >39)
Total CHOL/HDL Ratio: 3.6 Ratio
Triglycerides: 130 mg/dL (ref <150)
VLDL: 26 mg/dL (ref 0–40)

## 2012-04-04 LAB — CBC WITH DIFFERENTIAL/PLATELET
Basophils Relative: 1 % (ref 0–1)
Eosinophils Relative: 4 % (ref 0–5)
HCT: 45.5 % (ref 36.0–46.0)
Hemoglobin: 15.9 g/dL — ABNORMAL HIGH (ref 12.0–15.0)
MCHC: 34.9 g/dL (ref 30.0–36.0)
MCV: 90.3 fL (ref 78.0–100.0)
Monocytes Absolute: 0.4 10*3/uL (ref 0.1–1.0)
Monocytes Relative: 6 % (ref 3–12)
Neutro Abs: 3.2 10*3/uL (ref 1.7–7.7)
RDW: 13.4 % (ref 11.5–15.5)

## 2012-04-05 LAB — VITAMIN D 25 HYDROXY (VIT D DEFICIENCY, FRACTURES): Vit D, 25-Hydroxy: 40 ng/mL (ref 30–89)

## 2012-04-10 ENCOUNTER — Encounter: Payer: PRIVATE HEALTH INSURANCE | Admitting: Family Medicine

## 2012-04-19 ENCOUNTER — Encounter: Payer: Self-pay | Admitting: Family Medicine

## 2012-04-19 ENCOUNTER — Ambulatory Visit (INDEPENDENT_AMBULATORY_CARE_PROVIDER_SITE_OTHER): Payer: PRIVATE HEALTH INSURANCE | Admitting: Family Medicine

## 2012-04-19 VITALS — BP 116/73 | HR 70 | Wt 137.0 lb

## 2012-04-19 DIAGNOSIS — Z0000 Encounter for general adult medical examination without abnormal findings: Secondary | ICD-10-CM

## 2012-04-19 DIAGNOSIS — Z720 Tobacco use: Secondary | ICD-10-CM

## 2012-04-19 DIAGNOSIS — Z23 Encounter for immunization: Secondary | ICD-10-CM

## 2012-04-19 DIAGNOSIS — Z1211 Encounter for screening for malignant neoplasm of colon: Secondary | ICD-10-CM

## 2012-04-19 MED ORDER — BUPROPION HCL ER (XL) 150 MG PO TB24: 150.0000 mg | ORAL_TABLET | Freq: Every day | ORAL | Status: DC

## 2012-04-19 NOTE — Progress Notes (Signed)
  Subjective:     Kaitlyn Knox is a 52 y.o. female and is here for a comprehensive physical exam. The patient reports no problems. She eats very healthy and works out regularly.  History   Social History  . Marital Status: Divorced    Spouse Name: N/A    Number of Children: N/A  . Years of Education: N/A   Occupational History  . Not on file.   Social History Main Topics  . Smoking status: Current Every Day Smoker -- 1.00 packs/day    Types: Cigarettes  . Smokeless tobacco: Not on file  . Alcohol Use: Not on file  . Drug Use: Not on file  . Sexually Active: Not on file   Other Topics Concern  . Not on file   Social History Narrative  . No narrative on file   Health Maintenance  Topic Date Due  . Tetanus/tdap  05/16/1979  . Colonoscopy  05/16/2010  . Influenza Vaccine  09/03/2012  . Pap Smear  05/10/2013  . Mammogram  07/13/2013    The following portions of the patient's history were reviewed and updated as appropriate: allergies, current medications, past family history, past medical history, past social history, past surgical history and problem list.  Review of Systems A comprehensive review of systems was negative.   Objective:    BP 116/73  Pulse 70  Wt 137 lb (62.143 kg)  BMI 23.88 kg/m2 General appearance: alert, cooperative and appears stated age Head: Normocephalic, without obvious abnormality, atraumatic Eyes: conj clear, EOMi, PEERLA Ears: normal TM's and external ear canals both ears Nose: Nares normal. Septum midline. Mucosa normal. No drainage or sinus tenderness. Throat: lips, mucosa, and tongue normal; teeth and gums normal Neck: no adenopathy, no carotid bruit, no JVD, supple, symmetrical, trachea midline and thyroid not enlarged, symmetric, no tenderness/mass/nodules Back: symmetric, no curvature. ROM normal. No CVA tenderness. Lungs: clear to auscultation bilaterally Heart: regular rate and rhythm, S1, S2 normal, no murmur, click, rub or  gallop Abdomen: soft, non-tender; bowel sounds normal; no masses,  no organomegaly Extremities: extremities normal, atraumatic, no cyanosis or edema Pulses: 2+ and symmetric Skin: Skin color, texture, turgor normal. No rashes or lesions Lymph nodes: Cervical adenopathy: nl and Supraclavicular adenopathy: nl Neurologic: Alert and oriented X 3, normal strength and tone. Normal symmetric reflexes. Normal coordination and gait    Assessment:    Healthy female exam.      Plan:     See After Visit Summary for Counseling Recommendations  REfer for colon cancer screening Reviewed her lab results. Given copy today.  Keep up a regular exercise program and make sure you are eating a healthy diet Try to eat 4 servings of dairy a day, or if you are lactose intolerant take a calcium with vitamin D daily.  Your vaccines are up to date.   Tobacco abuse-she is interested in quitting smoking. She tried Chantix caused nausea and dreams which she did not like. She did use Wellbutrin about 10 years ago and was able to quit at that time with it. She tolerated it well without any side effects. She would like to retry it. We discussed potential side effects of the medication. Per prescription and sent to pharmacy. The extended release is covered under her insurance pretty well so we will do this to make this a more convenient.

## 2012-04-19 NOTE — Patient Instructions (Addendum)
Tdap given today Keep up a regular exercise program and make sure you are eating a healthy diet Try to eat 4 servings of dairy a day, or if you are lactose intolerant take a calcium with vitamin D daily.  Your vaccines are up to date.

## 2012-08-15 ENCOUNTER — Telehealth: Payer: Self-pay | Admitting: *Deleted

## 2012-08-15 NOTE — Telephone Encounter (Signed)
Ok will decrease dose to every other day for 2 weeks and then every third day for one week and then stop. Please tell her congratulations!!!!!!

## 2012-08-15 NOTE — Telephone Encounter (Signed)
Pt notified of taper dose on the Wellbutrin. Barry Dienes, LPN

## 2012-08-15 NOTE — Telephone Encounter (Signed)
Pt calls and states that you gave her Wellbutrin for smoking cessation in May and she quit smoking 1 week ago.  She wanted to know how long does she need to take the Wellbutrin after stopping and how to wean off if that's what she needs to do.  She has 12 days of med left right now. Barry Dienes, LPN

## 2012-08-27 ENCOUNTER — Telehealth: Payer: Self-pay | Admitting: *Deleted

## 2012-08-27 NOTE — Telephone Encounter (Signed)
Pt calls and states she has quit smoking now 3 weeks and tapering down on the Wellbutrin as directed but wanted to know if you could give her something to help curb her appetite- hungry all the time and really doesn't want to gain weight.  Didn't know if you could give her an appetite suppressant? Barry Dienes, LPN

## 2012-08-27 NOTE — Telephone Encounter (Signed)
I would recommend stay on teh wellubtrin for longer if only stopped smoking 3 weeks ago. Recommend to stay on for 8 weeks.  Also appetite suppresant are controlled substances and can't be rx over the phone

## 2012-08-27 NOTE — Telephone Encounter (Signed)
LMOM to return call. Kimberly Gordon, LPN  

## 2012-08-27 NOTE — Telephone Encounter (Signed)
Spoke to patient she sated that she did not want to stay on the Wellbutrin because she is tapering down on it, she wants to know if there is an appetite suppressant that she can take I did advise patient to schedule an appt but she questioned why she had to make an appt when you already have her history.

## 2012-08-27 NOTE — Telephone Encounter (Signed)
Because it is a stimulant we have to document a cardiac exam for safety reasons. And we also have to document negative cardiac history before starting it. This is a safety percussion for this particular medication.

## 2012-08-28 NOTE — Telephone Encounter (Signed)
LMOM to return call. Ravin Denardo, LPN  

## 2012-08-28 NOTE — Telephone Encounter (Signed)
Pt returned call and given MD information.  She will do some research on the drug and then if decides she wants to take it she will make appointment. Barry Dienes, LPN

## 2012-08-30 ENCOUNTER — Encounter: Payer: Self-pay | Admitting: Family Medicine

## 2012-08-30 ENCOUNTER — Ambulatory Visit (INDEPENDENT_AMBULATORY_CARE_PROVIDER_SITE_OTHER): Payer: PRIVATE HEALTH INSURANCE | Admitting: Family Medicine

## 2012-08-30 VITALS — BP 107/53 | HR 60 | Ht 63.6 in | Wt 141.0 lb

## 2012-08-30 DIAGNOSIS — M7712 Lateral epicondylitis, left elbow: Secondary | ICD-10-CM

## 2012-08-30 DIAGNOSIS — Z720 Tobacco use: Secondary | ICD-10-CM

## 2012-08-30 DIAGNOSIS — F17200 Nicotine dependence, unspecified, uncomplicated: Secondary | ICD-10-CM

## 2012-08-30 DIAGNOSIS — M7710 Lateral epicondylitis, unspecified elbow: Secondary | ICD-10-CM

## 2012-08-30 DIAGNOSIS — R635 Abnormal weight gain: Secondary | ICD-10-CM

## 2012-08-30 MED ORDER — PHENTERMINE HCL 15 MG PO CAPS: 15.0000 mg | ORAL_CAPSULE | ORAL | Status: DC

## 2012-08-30 NOTE — Progress Notes (Signed)
Subjective:    Patient ID: Kaitlyn Knox, female    DOB: Jun 17, 1960, 52 y.o.   MRN: [id number]  HPI Tob abuse - Quit date was 08/06/12.  She was using wellbutrin but has weaned that. Has gained about 6 lbs  In 3.5 weeeks since quitting eating.  Works out.  Gets hungry late at night. Instead of smoking at night she has been eating tootsie pops.    Wants to be on phentermine. She says she read up on it.  No CP or SOB. Working out 4 days per week.     Pain near the left elbow. Been on and off. She says she will get a soreness near the outer bone mineral sometimes will even shoot down towards her wrists. She's actually right-handed. She denies any trauma or injury. She has not tried any treatments for it.  Review of Systems     BP 107/53  Pulse 60  Ht 5' 3.6" (1.615 m)  Wt 141 lb (63.957 kg)  BMI 24.52 kg/m2    Allergies  Allergen Reactions  . Ciprofloxacin     REACTION: Hives  . Penicillins     REACTION: Hives  . Sulfamethoxazole W-Trimethoprim     No past medical history on file.  Past Surgical History  Procedure Laterality Date  . Tummy tuck  December 18th 2012  . Bilateral leg lift  December 2012    History   Social History  . Marital Status: Divorced    Spouse Name: N/A    Number of Children: N/A  . Years of Education: N/A   Occupational History  . Not on file.   Social History Main Topics  . Smoking status: Current Every Day Smoker -- 1.00 packs/day    Types: Cigarettes  . Smokeless tobacco: Not on file  . Alcohol Use: Not on file  . Drug Use: Not on file  . Sexual Activity: Not on file   Other Topics Concern  . Not on file   Social History Narrative  . No narrative on file    No family history on file.  Outpatient Encounter Prescriptions as of 08/30/2012  Medication Sig Dispense Refill  . norethindrone-ethinyl estradiol (FEMHRT 1/5) 1-5 MG-MCG TABS Take by mouth daily.      . [DISCONTINUED] buPROPion (WELLBUTRIN XL) 150 MG 24 hr tablet Take 1 tablet  (150 mg total) by mouth daily.  30 tablet  2  . phentermine 15 MG capsule Take 1 capsule (15 mg total) by mouth every morning.  30 capsule  0   No facility-administered encounter medications on file as of 08/30/2012.       Objective:   Physical Exam  Constitutional: She is oriented to person, place, and time. She appears well-developed and well-nourished.  HENT:  Head: Normocephalic and atraumatic.  Neck: Neck supple. No thyromegaly present.  Cardiovascular: Normal rate, regular rhythm and normal heart sounds.   Pulmonary/Chest: Effort normal and breath sounds normal.  Musculoskeletal: She exhibits no edema.  Tender over the left epicondyle. Pain with pronation. Normal strength. No strength or swelling.   Lymphadenopathy:    She has no cervical adenopathy.  Neurological: She is alert and oriented to person, place, and time.  Skin: Skin is warm and dry.  Psychiatric: She has a normal mood and affect. Her behavior is normal.          Assessment & Plan:  Tob abuse - congratulated her.   Continue to work on this daily.   Abnormal weight gain -  no cardiac sxs.  BP well controlled. Will start with low dose phentermine to help curb appetite.  Warned about potential S.E of the drugs. Stop immediately if CP, SOB, or palps.  No current cardiac sxs. No prior hx of heart dz.    Tendinitis, lateral epicondyle-discussed diagnosis. Given handout on stretches to do a home. Recommend icing it. Also recommend a tennis elbow strap. She can get these over-the-counter at the local drug store. If it's not improving over the next 3-4 weeks and please let me know and we can consider an injection.

## 2012-09-10 ENCOUNTER — Telehealth: Payer: Self-pay | Admitting: *Deleted

## 2012-09-10 NOTE — Telephone Encounter (Signed)
Pt called and stated that she increased the medication that Dr. Linford Arnold gave her to phentermine 30 mg. And stated that this is helping her a lot more and wanted her to be aware of the change.   Informed Dr. Linford Arnold of this and she stated that this was fine. I told her that if she began to have any side effects from this to call and let us know. She voiced understanding and agreed.Loralee Pacas [place]

## 2012-09-12 ENCOUNTER — Other Ambulatory Visit: Payer: Self-pay | Admitting: Family Medicine

## 2012-09-14 ENCOUNTER — Other Ambulatory Visit: Payer: Self-pay | Admitting: Family Medicine

## 2012-09-14 ENCOUNTER — Other Ambulatory Visit: Payer: Self-pay | Admitting: *Deleted

## 2012-09-14 MED ORDER — PHENTERMINE HCL 37.5 MG PO CAPS: 37.5000 mg | ORAL_CAPSULE | ORAL | Status: DC

## 2012-09-14 MED ORDER — PHENTERMINE HCL 30 MG PO CAPS: 30.0000 mg | ORAL_CAPSULE | ORAL | Status: DC

## 2012-10-29 ENCOUNTER — Other Ambulatory Visit: Payer: Self-pay | Admitting: Family Medicine

## 2012-11-08 ENCOUNTER — Other Ambulatory Visit: Payer: Self-pay

## 2012-12-12 ENCOUNTER — Other Ambulatory Visit: Payer: Self-pay | Admitting: Family Medicine

## 2012-12-15 ENCOUNTER — Other Ambulatory Visit: Payer: Self-pay | Admitting: Family Medicine

## 2012-12-18 ENCOUNTER — Other Ambulatory Visit: Payer: Self-pay | Admitting: Family Medicine

## 2012-12-18 ENCOUNTER — Other Ambulatory Visit: Payer: Self-pay | Admitting: *Deleted

## 2012-12-18 MED ORDER — PHENTERMINE HCL 30 MG PO CAPS: 30.0000 mg | ORAL_CAPSULE | ORAL | Status: DC

## 2012-12-20 ENCOUNTER — Telehealth: Payer: Self-pay | Admitting: *Deleted

## 2012-12-20 NOTE — Telephone Encounter (Signed)
Dana from [facility] called and stated that pt did not want the 30 mg tab she would prefer the 37.5 cap of phentermine instead. vo given for change.Loralee Pacas [place]

## 2013-01-14 ENCOUNTER — Telehealth: Payer: Self-pay | Admitting: *Deleted

## 2013-01-14 DIAGNOSIS — R5381 Other malaise: Secondary | ICD-10-CM

## 2013-01-14 DIAGNOSIS — E041 Nontoxic single thyroid nodule: Secondary | ICD-10-CM

## 2013-01-14 DIAGNOSIS — R5383 Other fatigue: Principal | ICD-10-CM

## 2013-01-14 NOTE — Telephone Encounter (Signed)
WE can check it anytime she would like.  The test is very sensitve.  I am not sure how much it would be out of pocket.

## 2013-01-14 NOTE — Telephone Encounter (Signed)
Pt would like to be contacted via cell .Audelia Hives [place]

## 2013-01-14 NOTE — Telephone Encounter (Signed)
Pt feels that she needs to have her tsh tested she feels that her thyroid is off. She stated that she wanted to know how reliable the lab test are to find out if she has some type of issue. Pt reports being tired, hungry and constipated. She checked with her insurance and they will not pay for all of the labs to be ran until April when her insurance will pay for this. Please advise.Kaitlyn Knox [place]

## 2013-01-16 NOTE — Telephone Encounter (Signed)
Pt called back and informed order placed.Audelia Hives [place]

## 2013-01-18 LAB — TSH: TSH: 0.692 u[IU]/mL (ref 0.350–4.500)

## 2013-01-18 NOTE — Telephone Encounter (Signed)
Quick Note:  All labs are normal. ______ 

## 2013-01-28 ENCOUNTER — Encounter: Payer: Self-pay | Admitting: Family Medicine

## 2013-01-28 ENCOUNTER — Ambulatory Visit (INDEPENDENT_AMBULATORY_CARE_PROVIDER_SITE_OTHER): Payer: PRIVATE HEALTH INSURANCE | Admitting: Family Medicine

## 2013-01-28 VITALS — BP 107/65 | HR 58 | Temp 97.5°F | Ht 63.6 in | Wt 143.0 lb

## 2013-01-28 DIAGNOSIS — R6889 Other general symptoms and signs: Secondary | ICD-10-CM

## 2013-01-28 DIAGNOSIS — J029 Acute pharyngitis, unspecified: Secondary | ICD-10-CM

## 2013-01-28 DIAGNOSIS — J069 Acute upper respiratory infection, unspecified: Secondary | ICD-10-CM

## 2013-01-28 LAB — POC INFLUENZA A&B (BINAX/QUICKVUE)
INFLUENZA A, POC: NEGATIVE
INFLUENZA B, POC: NEGATIVE

## 2013-01-28 LAB — POCT RAPID STREP A (OFFICE): RAPID STREP A SCREEN: NEGATIVE

## 2013-01-28 NOTE — Progress Notes (Signed)
   Subjective:    Patient ID: Kaitlyn Knox, female    DOB: 01/30/1960, 53 y.o.   MRN: [id number]  HPI Woke up w/ ST, ear pain, congestion, and nasal congestion yesterday.  She has been using Advil for pain relief. Has has myalgias. Thinks may have had a fever.  +  Runny nose.    Review of Systems     Objective:   Physical Exam  Constitutional: She is oriented to person, place, and time. She appears well-developed and well-nourished.  HENT:  Head: Normocephalic and atraumatic.  Right Ear: External ear normal.  Left Ear: External ear normal.  Nose: Nose normal.  Mouth/Throat: Oropharynx is clear and moist.  TMs and canals are clear.   Eyes: Conjunctivae and EOM are normal. Pupils are equal, round, and reactive to light.  Neck: Neck supple. No thyromegaly present.  Cardiovascular: Normal rate, regular rhythm and normal heart sounds.   Pulmonary/Chest: Effort normal and breath sounds normal. She has no wheezes.  Lymphadenopathy:    She has no cervical adenopathy.  Neurological: She is alert and oriented to person, place, and time.  Skin: Skin is warm and dry.  Psychiatric: She has a normal mood and affect.          Assessment & Plan:  URI - neg for strep.  Flu test was negative as well. Most likely viral illness. Recommend symptomatic care. If she feels she's getting worse or has fever for more than 3 days then please give [place] a call back. Symptomatic care recommended.

## 2013-03-21 ENCOUNTER — Encounter: Payer: Self-pay | Admitting: Family Medicine

## 2013-04-24 ENCOUNTER — Other Ambulatory Visit: Payer: Self-pay | Admitting: Family Medicine

## 2013-06-10 ENCOUNTER — Ambulatory Visit (INDEPENDENT_AMBULATORY_CARE_PROVIDER_SITE_OTHER): Payer: PRIVATE HEALTH INSURANCE | Admitting: Physician Assistant

## 2013-06-10 ENCOUNTER — Encounter: Payer: Self-pay | Admitting: Physician Assistant

## 2013-06-10 VITALS — BP 106/62 | HR 60 | Wt 135.0 lb

## 2013-06-10 DIAGNOSIS — L678 Other hair color and hair shaft abnormalities: Secondary | ICD-10-CM

## 2013-06-10 DIAGNOSIS — L738 Other specified follicular disorders: Secondary | ICD-10-CM

## 2013-06-10 DIAGNOSIS — L720 Epidermal cyst: Secondary | ICD-10-CM

## 2013-06-10 DIAGNOSIS — L723 Sebaceous cyst: Secondary | ICD-10-CM

## 2013-06-10 MED ORDER — DOXYCYCLINE HYCLATE 100 MG PO TABS: 100.0000 mg | ORAL_TABLET | Freq: Two times a day (BID) | ORAL | Status: DC

## 2013-06-10 NOTE — Progress Notes (Signed)
   Subjective:    Patient ID: Kaitlyn Knox, female    DOB: 10/02/1960, 53 y.o.   MRN: [id number]  HPI Patient is a 53 year old female who presents to the clinic with warm, tender, not on her upper right back. She has had the small not for many months. Dr. Charise Carwin has seen this and thought it was a sebaceous cyst. She did not want to do anything about it until this weekend. She went hiking in her sports bra do all we can. It is now red and tender to the touch. She denies any fever, chills, nausea or vomiting. She's not had anything to make better and nothing seems to make worse. She did try warm compresses to bring to ahead and will not come to a head.   Review of Systems  All other systems reviewed and are negative.      Objective:   Physical Exam  Constitutional: She is oriented to person, place, and time. She appears well-developed and well-nourished.  HENT:  Head: Normocephalic and atraumatic.  Cardiovascular: Normal rate, regular rhythm and normal heart sounds.   Pulmonary/Chest: Effort normal and breath sounds normal.  Neurological: She is alert and oriented to person, place, and time.  Skin:     Psychiatric: She has a normal mood and affect. Her behavior is normal.          Assessment & Plan:  Sebaceous cyst-   Sebaceous Cyst Excision Procedure Note  Pre-operative Diagnosis: Sebaceous cyst,  Post-operative Diagnosis: same  Locations: Right upper back, the above diaphragm  Indications: Pain and irritation  Anesthesia: Lidocaine 2% with epinephrine without added sodium bicarbonate  Procedure Details  History of allergy to iodine: no  Patient informed of the risks (including bleeding and infection) and benefits of the  procedure and Verbal informed consent obtained.  The lesion and surrounding area was given a sterile prep using alcohol and draped in the usual sterile fashion. An incision was made over the cyst, which was dissected free of the surrounding  tissue and removed.  The cyst was filled with typical sebaceous material.  The wound was closed with 4-0 ethilon using simple interrupted stitches. Antibiotic ointment and a sterile dressing applied.  The specimen was  sent for pathologic examination. The patient tolerated the procedure well.  EBL: scant  Findings: Purulent drainage with sac.  Condition: Stable  Complications: none.  Plan: 1. Instructed to keep the wound dry and covered for 24-48h and clean thereafter. 2. Warning signs of infection were reviewed.   3. Recommended that the patient use OTC acetaminophen as needed for pain.  4. Return for suture removal in 7 days.  Since I feel like this was infected she was given doxycycline for 10 days due to allergies to sulfa drugs.

## 2013-06-10 NOTE — Patient Instructions (Signed)

## 2013-06-13 LAB — WOUND CULTURE: Gram Stain: NONE SEEN

## 2013-06-17 ENCOUNTER — Encounter: Payer: Self-pay | Admitting: Physician Assistant

## 2013-06-17 ENCOUNTER — Ambulatory Visit (INDEPENDENT_AMBULATORY_CARE_PROVIDER_SITE_OTHER): Payer: PRIVATE HEALTH INSURANCE | Admitting: Physician Assistant

## 2013-06-17 VITALS — BP 114/68 | HR 74 | Ht 63.0 in | Wt 135.0 lb

## 2013-06-17 DIAGNOSIS — L723 Sebaceous cyst: Secondary | ICD-10-CM

## 2013-06-17 NOTE — Progress Notes (Signed)
   Subjective:    Patient ID: Kaitlyn Knox, female    DOB: Dec 22, 1960, 53 y.o.   MRN: [id number]  HPI Patient returns to the clinic after sebaceous cyst removed 10 days. No problems or concerns. Continues on doxycycline.    Review of Systems  All other systems reviewed and are negative.      Objective:   Physical Exam  Musculoskeletal:       Arms:         Assessment & Plan:  Sebaceous cyst- incision has healed well. No active drainage or discharge. It is apparent that all 3 stitches have fallen out. No removal was done today. Finish abx and follow up as needed. Encouraged vitamin E for scar prevention.

## 2013-08-27 ENCOUNTER — Ambulatory Visit (INDEPENDENT_AMBULATORY_CARE_PROVIDER_SITE_OTHER): Payer: PRIVATE HEALTH INSURANCE | Admitting: Family Medicine

## 2013-08-27 ENCOUNTER — Encounter: Payer: Self-pay | Admitting: Family Medicine

## 2013-08-27 VITALS — BP 133/71 | HR 56 | Temp 98.1°F | Wt 139.0 lb

## 2013-08-27 DIAGNOSIS — J0110 Acute frontal sinusitis, unspecified: Secondary | ICD-10-CM

## 2013-08-27 MED ORDER — AZITHROMYCIN 250 MG PO TABS: ORAL_TABLET | ORAL | Status: DC

## 2013-08-27 NOTE — Progress Notes (Signed)
   Subjective:    Patient ID: Danford Bad, female    DOB: August 09, 1960, 53 y.o.   MRN: [id number]  Sore Throat  Associated symptoms include ear pain.  Otalgia    ST and rhinorrhea x 10 days. Taking Advil and Benadryl.  No fever, chills or sweats. + HA and teeth pain. Ears have been aching.  No GI sxs.    Review of Systems  HENT: Positive for ear pain.        Objective:   Physical Exam  Constitutional: She is oriented to person, place, and time. She appears well-developed and well-nourished.  HENT:  Head: Normocephalic and atraumatic.  Right Ear: External ear normal.  Left Ear: External ear normal.  Nose: Nose normal.  Mouth/Throat: Oropharynx is clear and moist.  TMs and canals are clear.   Eyes: Conjunctivae and EOM are normal. Pupils are equal, round, and reactive to light.  Neck: Neck supple. No thyromegaly present.  Cardiovascular: Normal rate, regular rhythm and normal heart sounds.   Pulmonary/Chest: Effort normal and breath sounds normal. She has no wheezes.  Lymphadenopathy:    She has no cervical adenopathy.  Neurological: She is alert and oriented to person, place, and time.  Skin: Skin is warm and dry.  Psychiatric: She has a normal mood and affect.          Assessment & Plan:  Acute frontal sinusitis - will tx with zpack, can use 24 hour acting antihistamine to dry up secretions. Call if not better in one week.

## 2013-09-05 ENCOUNTER — Telehealth: Payer: Self-pay | Admitting: Family Medicine

## 2013-09-05 NOTE — Telephone Encounter (Signed)
Pt called. She has a physical scheduled on 9/22 and wants lab Order sent down tomorrow.   Please call her on [phone] when Order is ready. Thank you.

## 2013-09-05 NOTE — Telephone Encounter (Signed)
Send to [place], thanks.

## 2013-09-06 ENCOUNTER — Other Ambulatory Visit: Payer: Self-pay | Admitting: Family Medicine

## 2013-09-06 DIAGNOSIS — M949 Disorder of cartilage, unspecified: Secondary | ICD-10-CM

## 2013-09-06 DIAGNOSIS — R5381 Other malaise: Secondary | ICD-10-CM

## 2013-09-06 DIAGNOSIS — M899 Disorder of bone, unspecified: Secondary | ICD-10-CM

## 2013-09-06 DIAGNOSIS — R5383 Other fatigue: Secondary | ICD-10-CM

## 2013-09-06 DIAGNOSIS — E041 Nontoxic single thyroid nodule: Secondary | ICD-10-CM

## 2013-09-06 DIAGNOSIS — Z0000 Encounter for general adult medical examination without abnormal findings: Secondary | ICD-10-CM

## 2013-09-06 NOTE — Telephone Encounter (Signed)
Order faxed and patient aware.

## 2013-09-06 NOTE — Telephone Encounter (Signed)
Cmp, lipid, b12, tsh, vit D, cbc

## 2013-09-24 ENCOUNTER — Encounter: Payer: Self-pay | Admitting: Family Medicine

## 2013-09-24 ENCOUNTER — Ambulatory Visit (INDEPENDENT_AMBULATORY_CARE_PROVIDER_SITE_OTHER): Payer: PRIVATE HEALTH INSURANCE | Admitting: Family Medicine

## 2013-09-24 VITALS — BP 126/81 | HR 54 | Wt 138.0 lb

## 2013-09-24 DIAGNOSIS — Z0000 Encounter for general adult medical examination without abnormal findings: Secondary | ICD-10-CM

## 2013-09-24 NOTE — Progress Notes (Signed)
   Subjective:    Patient ID: Kaitlyn Knox, female    DOB: 27-Oct-1960, 53 y.o.   MRN: [id number]  HPI    Review of Systems     Objective:   Physical Exam        Assessment & Plan:    Subjective:     Kaitlyn Knox is a 53 y.o. female and is here for a comprehensive physical exam. The patient reports no problems.  History   Social History  . Marital Status: Divorced    Spouse Name: N/A    Number of Children: N/A  . Years of Education: N/A   Occupational History  . Not on file.   Social History Main Topics  . Smoking status: Current Every Day Smoker -- 1.00 packs/day    Types: Cigarettes  . Smokeless tobacco: Not on file  . Alcohol Use: Not on file  . Drug Use: Not on file  . Sexual Activity: Not on file   Other Topics Concern  . Not on file   Social History Narrative  . No narrative on file   Health Maintenance  Topic Date Due  . Mammogram  07/13/2013  . Influenza Vaccine  04/03/2014 (Originally 08/03/2013)  . Pap Smear  10/03/2015  . Colonoscopy  03/05/2018  . Tetanus/tdap  04/20/2022    The following portions of the patient's history were reviewed and updated as appropriate: allergies, current medications, past family history, past medical history, past social history, past surgical history and problem list.  Review of Systems A comprehensive review of systems was negative.   Objective:    BP 126/81  Pulse 54  Wt 138 lb (62.596 kg) General appearance: alert, cooperative and appears stated age Head: Normocephalic, without obvious abnormality, atraumatic Eyes: conjc clear, EOMi, PEERLA Ears: normal TM's and external ear canals both ears Nose: Nares normal. Septum midline. Mucosa normal. No drainage or sinus tenderness. Throat: lips, mucosa, and tongue normal; teeth and gums normal Neck: no adenopathy, no carotid bruit, no JVD, supple, symmetrical, trachea midline and thyroid not enlarged, symmetric, no tenderness/mass/nodules Back: symmetric,  no curvature. ROM normal. No CVA tenderness. Lungs: clear to auscultation bilaterally Heart: regular rate and rhythm, S1, S2 normal, no murmur, click, rub or gallop Abdomen: soft, non-tender; bowel sounds normal; no masses,  no organomegaly Extremities: extremities normal, atraumatic, no cyanosis or edema Pulses: 2+ and symmetric Skin: Skin color, texture, turgor normal. No rashes or lesions Lymph nodes: Cervical, supraclavicular, and axillary nodes normal. Neurologic: Alert and oriented X 3, normal strength and tone. Normal symmetric reflexes. Normal coordination and gait    Assessment:    Healthy female exam.      Plan:     See After Visit Summary for Counseling Recommendations  Keep up a regular exercise program and make sure you are eating a healthy diet Try to eat 4 servings of dairy a day, or if you are lactose intolerant take a calcium with vitamin D daily.  Your vaccines are up to date.  Due for colonoscopy in 5 years.    Declined flu shot. Will get at work on Saturday.

## 2013-09-24 NOTE — Patient Instructions (Signed)
Keep up a regular exercise program and make sure you are eating a healthy diet Try to eat 4 servings of dairy a day, or if you are lactose intolerant take a calcium with vitamin D daily.  Your vaccines are up to date.   

## 2013-09-26 LAB — LIPID PANEL
CHOLESTEROL: 188 mg/dL (ref 0–200)
HDL: 79 mg/dL (ref >39)
LDL Cholesterol: 92 mg/dL (ref 0–99)
Total CHOL/HDL Ratio: 2.4 Ratio
Triglycerides: 84 mg/dL (ref <150)
VLDL: 17 mg/dL (ref 0–40)

## 2013-09-26 LAB — CBC WITH DIFFERENTIAL/PLATELET
BASOS ABS: 0 10*3/uL (ref 0.0–0.1)
Basophils Relative: 0 % (ref 0–1)
Eosinophils Absolute: 0 10*3/uL (ref 0.0–0.7)
Eosinophils Relative: 1 % (ref 0–5)
HCT: 44.4 % (ref 36.0–46.0)
HEMOGLOBIN: 14.7 g/dL (ref 12.0–15.0)
LYMPHS PCT: 32 % (ref 12–46)
Lymphs Abs: 1.4 10*3/uL (ref 0.7–4.0)
MCH: 30.3 pg (ref 26.0–34.0)
MCHC: 33.1 g/dL (ref 30.0–36.0)
MCV: 91.5 fL (ref 78.0–100.0)
MONOS PCT: 7 % (ref 3–12)
Monocytes Absolute: 0.3 10*3/uL (ref 0.1–1.0)
NEUTROS ABS: 2.7 10*3/uL (ref 1.7–7.7)
Neutrophils Relative %: 60 % (ref 43–77)
Platelets: 214 10*3/uL (ref 150–400)
RBC: 4.85 MIL/uL (ref 3.87–5.11)
RDW: 13.3 % (ref 11.5–15.5)
WBC: 4.5 10*3/uL (ref 4.0–10.5)

## 2013-09-26 LAB — COMPLETE METABOLIC PANEL WITH GFR
ALBUMIN: 4.1 g/dL (ref 3.5–5.2)
ALT: 19 U/L (ref 0–35)
AST: 23 U/L (ref 0–37)
Alkaline Phosphatase: 53 U/L (ref 39–117)
BUN: 13 mg/dL (ref 6–23)
CALCIUM: 9.2 mg/dL (ref 8.4–10.5)
CHLORIDE: 105 meq/L (ref 96–112)
CO2: 27 meq/L (ref 19–32)
Creat: 0.82 mg/dL (ref 0.50–1.10)
GFR, EST NON AFRICAN AMERICAN: 82 mL/min
GLUCOSE: 80 mg/dL (ref 70–99)
POTASSIUM: 4.2 meq/L (ref 3.5–5.3)
SODIUM: 141 meq/L (ref 135–145)
TOTAL PROTEIN: 6.6 g/dL (ref 6.0–8.3)
Total Bilirubin: 0.4 mg/dL (ref 0.2–1.2)

## 2013-09-26 LAB — VITAMIN D 25 HYDROXY (VIT D DEFICIENCY, FRACTURES): VIT D 25 HYDROXY: 52 ng/mL (ref 30–89)

## 2013-09-26 LAB — TSH: TSH: 0.6 u[IU]/mL (ref 0.350–4.500)

## 2013-09-26 LAB — VITAMIN B12: Vitamin B-12: 855 pg/mL (ref 211–911)

## 2013-11-25 ENCOUNTER — Other Ambulatory Visit: Payer: Self-pay | Admitting: Family Medicine

## 2013-12-03 LAB — HM DEXA SCAN

## 2013-12-31 LAB — HM MAMMOGRAPHY

## 2014-02-05 ENCOUNTER — Ambulatory Visit (INDEPENDENT_AMBULATORY_CARE_PROVIDER_SITE_OTHER): Payer: PRIVATE HEALTH INSURANCE | Admitting: Family Medicine

## 2014-02-05 ENCOUNTER — Telehealth: Payer: Self-pay | Admitting: Emergency Medicine

## 2014-02-05 VITALS — BP 108/69 | HR 72 | Temp 98.7°F | Resp 16

## 2014-02-05 DIAGNOSIS — N3000 Acute cystitis without hematuria: Secondary | ICD-10-CM

## 2014-02-05 DIAGNOSIS — R300 Dysuria: Secondary | ICD-10-CM

## 2014-02-05 LAB — POCT URINALYSIS DIPSTICK
BILIRUBIN UA: NEGATIVE
Blood, UA: NEGATIVE
GLUCOSE UA: NEGATIVE
KETONES UA: NEGATIVE
NITRITE UA: POSITIVE
Protein, UA: NEGATIVE
Spec Grav, UA: 1.015
Urobilinogen, UA: 0.2
pH, UA: 7

## 2014-02-05 MED ORDER — NITROFURANTOIN MACROCRYSTAL 100 MG PO CAPS: 100.0000 mg | ORAL_CAPSULE | Freq: Two times a day (BID) | ORAL | Status: DC

## 2014-02-05 NOTE — Progress Notes (Signed)
   Subjective:    Patient ID: Kaitlyn Knox, female    DOB: Feb 22, 1960, 54 y.o.   MRN: [id number]  HPIPatient reports onset of dysuria and frequency 01-20-14; called company MD and was placed on Keflex 500mg  qid for 10 days; took 7 days of doses; symptoms returned. Is using rx for Pyridium.  Here today to get U/A POCT and have specimen sent for C&S.   Review of Systems     Objective:   Physical Exam        Assessment & Plan:  U/A results given to Dr.Metheney; patient desires rx phoned to [facility] on [address]:[phone].  Urinalysis positive for nitrites and leukocytes. Sent for culture. Will call in nitrofurantoin since she has intolerance to ciprofloxacin as well as sulfa drugs.  Beatrice Lecher, MD

## 2014-02-05 NOTE — Telephone Encounter (Signed)
I agree. If she has failed initial treatment then she needs to at least have a urine sample collected to make sure she doesn't have a resistant infection. Also less just make sure to take vitals and check her temperature as well.

## 2014-02-05 NOTE — Telephone Encounter (Signed)
Patient call to request tx for UTI; had service at work that allows call in to company MD for rx; she took med and it has not resolved. Advised her to come in today for nurse visit andgive spec for culture and sensitivity.

## 2014-02-07 LAB — URINE CULTURE
COLONY COUNT: NO GROWTH
Organism ID, Bacteria: NO GROWTH

## 2014-05-05 ENCOUNTER — Other Ambulatory Visit: Payer: Self-pay | Admitting: Family Medicine

## 2014-07-27 ENCOUNTER — Encounter: Payer: Self-pay | Admitting: *Deleted

## 2014-08-07 ENCOUNTER — Other Ambulatory Visit: Payer: Self-pay | Admitting: Physician Assistant

## 2014-10-21 LAB — HEPATIC FUNCTION PANEL
ALT: 17 U/L (ref 7–35)
AST: 26 U/L (ref 13–35)
Alkaline Phosphatase: 56 U/L (ref 25–125)

## 2014-10-21 LAB — LIPID PANEL
Cholesterol: 228 mg/dL — AB (ref 0–200)
HDL: 85 mg/dL — AB (ref 35–70)
LDL Cholesterol: 123 mg/dL
Ldl Cholesterol, Calc: 123
Triglycerides: 99 mg/dL (ref 40–160)

## 2014-10-21 LAB — BASIC METABOLIC PANEL: Potassium: 5.4 mmol/L — AB (ref 3.4–5.3)

## 2014-12-21 ENCOUNTER — Other Ambulatory Visit: Payer: Self-pay | Admitting: Family Medicine

## 2014-12-25 ENCOUNTER — Other Ambulatory Visit: Payer: Self-pay

## 2014-12-25 MED ORDER — DIPHENOXYLATE-ATROPINE 2.5-0.025 MG PO TABS: 1.0000 | ORAL_TABLET | Freq: Four times a day (QID) | ORAL | Status: DC

## 2015-01-28 ENCOUNTER — Encounter: Payer: Self-pay | Admitting: Family Medicine

## 2015-02-03 ENCOUNTER — Encounter: Payer: Self-pay | Admitting: Family Medicine

## 2015-02-03 ENCOUNTER — Ambulatory Visit (INDEPENDENT_AMBULATORY_CARE_PROVIDER_SITE_OTHER): Payer: BLUE CROSS/BLUE SHIELD | Admitting: Family Medicine

## 2015-02-03 VITALS — BP 118/74 | HR 79 | Wt 143.0 lb

## 2015-02-03 DIAGNOSIS — Z1159 Encounter for screening for other viral diseases: Secondary | ICD-10-CM

## 2015-02-03 DIAGNOSIS — Z0000 Encounter for general adult medical examination without abnormal findings: Secondary | ICD-10-CM

## 2015-02-03 DIAGNOSIS — Z114 Encounter for screening for human immunodeficiency virus [HIV]: Secondary | ICD-10-CM | POA: Diagnosis not present

## 2015-02-03 DIAGNOSIS — Z0189 Encounter for other specified special examinations: Secondary | ICD-10-CM | POA: Diagnosis not present

## 2015-02-03 NOTE — Progress Notes (Signed)
  Subjective:     Kaitlyn Knox is a 55 y.o. female and is here for a comprehensive physical exam. The patient reports problems - had been under a little more stress with taking care of her parents. her father has had some health issues and worrided about her mother and new onset dementia.  Social History   Social History  . Marital Status: Divorced    Spouse Name: N/A  . Number of Children: N/A  . Years of Education: N/A   Occupational History  . Not on file.   Social History Main Topics  . Smoking status: Former Smoker -- 1.00 packs/day    Types: Cigarettes    Quit date: 08/17/2012  . Smokeless tobacco: Not on file  . Alcohol Use: Not on file  . Drug Use: Not on file  . Sexual Activity: Not on file   Other Topics Concern  . Not on file   Social History Narrative   Health Maintenance  Topic Date Due  . Hepatitis C Screening  1960-06-20  . HIV Screening  05/16/1975  . INFLUENZA VACCINE  08/04/2014  . PAP SMEAR  10/03/2015  . MAMMOGRAM  01/01/2016  . COLONOSCOPY  03/05/2018  . TETANUS/TDAP  04/20/2022    The following portions of the patient's history were reviewed and updated as appropriate: allergies, current medications, past family history, past medical history, past social history, past surgical history and problem list.  Review of Systems A comprehensive review of systems was negative.   Objective:    BP 118/74 mmHg  Pulse 79  Wt 143 lb (64.864 kg)  SpO2 100% General appearance: alert, cooperative and appears stated age Head: Normocephalic, without obvious abnormality, atraumatic Eyes: conj clear, EOMI, PEERLA Ears: normal TM's and external ear canals both ears Nose: Nares normal. Septum midline. Mucosa normal. No drainage or sinus tenderness. Throat: lips, mucosa, and tongue normal; teeth and gums normal Neck: no adenopathy, no carotid bruit, no JVD, supple, symmetrical, trachea midline and thyroid not enlarged, symmetric, no  tenderness/mass/nodules Back: symmetric, no curvature. ROM normal. No CVA tenderness. Lungs: clear to auscultation bilaterally Breasts: normal appearance, no masses or tenderness Heart: regular rate and rhythm, S1, S2 normal, no murmur, click, rub or gallop Abdomen: soft, non-tender; bowel sounds normal; no masses,  no organomegaly Extremities: extremities normal, atraumatic, no cyanosis or edema Pulses: 2+ and symmetric Skin: Skin color, texture, turgor normal. No rashes or lesions Lymph nodes: Cervical, supraclavicular, and axillary nodes normal. Neurologic: Alert and oriented X 3, normal strength and tone. Normal symmetric reflexes. Normal coordination and gait    Assessment:    Healthy female exam.      Plan:     See After Visit Summary for Counseling Recommendations  Keep up a regular exercise program and make sure you are eating a healthy diet Try to eat 4 servings of dairy a day, or if you are lactose intolerant take a calcium with vitamin D daily.  Your vaccines are up to date.  Due for hepatitis C and HIV screening. She artery had lipids etc. done through her work back in October. She did have an abnormal potassium levels around to recheck that today.

## 2015-02-03 NOTE — Patient Instructions (Signed)
Keep up a regular exercise program and make sure you are eating a healthy diet Try to eat 4 servings of dairy a day, or if you are lactose intolerant take a calcium with vitamin D daily.  Your vaccines are up to date.   

## 2015-03-03 ENCOUNTER — Encounter: Payer: Self-pay | Admitting: Family Medicine

## 2015-04-14 DIAGNOSIS — Z713 Dietary counseling and surveillance: Secondary | ICD-10-CM | POA: Diagnosis not present

## 2015-08-08 ENCOUNTER — Other Ambulatory Visit: Payer: Self-pay | Admitting: Family Medicine

## 2015-08-15 ENCOUNTER — Other Ambulatory Visit: Payer: Self-pay | Admitting: Family Medicine

## 2015-08-17 DIAGNOSIS — Z1231 Encounter for screening mammogram for malignant neoplasm of breast: Secondary | ICD-10-CM | POA: Diagnosis not present

## 2015-10-27 DIAGNOSIS — Z23 Encounter for immunization: Secondary | ICD-10-CM | POA: Diagnosis not present

## 2015-11-05 DIAGNOSIS — R635 Abnormal weight gain: Secondary | ICD-10-CM | POA: Diagnosis not present

## 2016-02-08 ENCOUNTER — Emergency Department
Admission: EM | Admit: 2016-02-08 | Discharge: 2016-02-08 | Disposition: A | Discharge status: Home / Self Care | Payer: BLUE CROSS/BLUE SHIELD | Source: Home / Self Care | Attending: Family Medicine | Admitting: Family Medicine

## 2016-02-08 ENCOUNTER — Encounter: Payer: Self-pay | Admitting: Emergency Medicine

## 2016-02-08 DIAGNOSIS — J019 Acute sinusitis, unspecified: Secondary | ICD-10-CM | POA: Diagnosis not present

## 2016-02-08 DIAGNOSIS — J069 Acute upper respiratory infection, unspecified: Secondary | ICD-10-CM

## 2016-02-08 DIAGNOSIS — Z713 Dietary counseling and surveillance: Secondary | ICD-10-CM | POA: Diagnosis not present

## 2016-02-08 MED ORDER — PREDNISONE 20 MG PO TABS: ORAL_TABLET | ORAL | 0 refills | Status: DC

## 2016-02-08 MED ORDER — DOXYCYCLINE HYCLATE 100 MG PO CAPS: 100.0000 mg | ORAL_CAPSULE | Freq: Two times a day (BID) | ORAL | 0 refills | Status: DC

## 2016-02-08 NOTE — ED Triage Notes (Signed)
Sore throat, left ear pain, headache, congestion intermittently for 6 weeks

## 2016-02-08 NOTE — ED Provider Notes (Signed)
CSN: [id number]     Arrival date & time 02/08/16  1528 History   First MD Initiated Contact with Patient 02/08/16 1551     Chief Complaint  Patient presents with  . URI   (Consider location/radiation/quality/duration/timing/severity/associated sxs/prior Treatment) HPI Kaitlyn Knox is a 56 y.o. female presenting to UC with c/o sore throat, Left ear pain, generalized headache, and congestion intermittently for 6 weeks.    She initially had azithromycin but states the sinus symptoms never completely cleared. She is allergic to sulfa drugs and PCN.  Denies known fever. Denies n/v/d.    History reviewed. No pertinent past medical history. Past Surgical History:  Procedure Laterality Date  . Bilateral Leg Lift  December 2012  . Tummy tuck  December 18th 2012   No family history on file. Social History  Substance Use Topics  . Smoking status: Former Smoker    Packs/day: 1.00    Types: Cigarettes    Quit date: 08/17/2012  . Smokeless tobacco: Never Used  . Alcohol use Not on file   OB History    No data available     Review of Systems  Constitutional: Negative for chills and fever.  HENT: Positive for congestion, ear pain ( bilateral fullness), rhinorrhea, sinus pain and sinus pressure. Negative for sore throat, trouble swallowing and voice change.   Respiratory: Positive for cough. Negative for shortness of breath.   Cardiovascular: Negative for chest pain and palpitations.  Gastrointestinal: Negative for abdominal pain, diarrhea, nausea and vomiting.  Musculoskeletal: Negative for arthralgias, back pain and myalgias.  Skin: Negative for rash.  Neurological: Positive for headaches. Negative for dizziness and light-headedness.    Allergies  Ciprofloxacin; Penicillins; and Sulfamethoxazole-trimethoprim  Home Medications   Prior to Admission medications   Medication Sig Start Date End Date Taking? Authorizing Provider  diphenhydrAMINE (BENADRYL) 25 mg capsule Take 25 mg by  mouth every 6 (six) hours as needed.    Historical Provider, MD  doxycycline (VIBRAMYCIN) 100 MG capsule Take 1 capsule (100 mg total) by mouth 2 (two) times daily. for 10 days 02/08/16   Noland Fordyce, PA-C  norethindrone-ethinyl estradiol (FEMHRT 1/5) 1-5 MG-MCG TABS Take by mouth daily.    Historical Provider, MD  predniSONE (DELTASONE) 20 MG tablet 3 tabs po day one, then 2 po daily x 4 days 02/08/16   Noland Fordyce, PA-C   Meds Ordered and Administered this Visit  Medications - No data to display  BP 134/84 (BP Location: Left Arm)   Pulse 60   Temp 98 F (36.7 C) (Oral)   Ht 5\' 3"  (1.6 m)   Wt 145 lb (65.8 kg)   SpO2 99%   BMI 25.69 kg/m  No data found.   Physical Exam  Constitutional: She is oriented to person, place, and time. She appears well-developed and well-nourished. No distress.  HENT:  Head: Normocephalic and atraumatic.  Right Ear: Tympanic membrane normal.  Left Ear: Tympanic membrane normal.  Nose: Mucosal edema present. Right sinus exhibits maxillary sinus tenderness and frontal sinus tenderness. Left sinus exhibits maxillary sinus tenderness and frontal sinus tenderness.  Mouth/Throat: Uvula is midline, oropharynx is clear and moist and mucous membranes are normal.  Eyes: EOM are normal.  Neck: Normal range of motion. Neck supple.  Cardiovascular: Normal rate and regular rhythm.   Pulmonary/Chest: Effort normal and breath sounds normal. No stridor. No respiratory distress. She has no wheezes. She has no rales.  Musculoskeletal: Normal range of motion.  Lymphadenopathy:    She  has no cervical adenopathy.  Neurological: She is alert and oriented to person, place, and time.  Skin: Skin is warm and dry. She is not diaphoretic.  Psychiatric: She has a normal mood and affect. Her behavior is normal.  Nursing note and vitals reviewed.   Urgent Care Course     Procedures (including critical care time)  Labs Review Labs Reviewed - No data to display  Imaging  Review No results found.    MDM   1. Acute rhinosinusitis   2. Upper respiratory tract infection, unspecified type    Pt c/o intermittent URI and sinus symptoms for 6 weeks. She already completed a course of azithromycin.   Hx and exam c/w sinusitis. Rx: Doxycycline and prednisone  Encouraged sinus rinses. F/u with PCP in 1 week if not improving.     Noland Fordyce, PA-C 02/08/16 2005

## 2016-02-09 ENCOUNTER — Ambulatory Visit: Payer: BLUE CROSS/BLUE SHIELD | Admitting: Osteopathic Medicine

## 2016-03-25 DIAGNOSIS — Z01419 Encounter for gynecological examination (general) (routine) without abnormal findings: Secondary | ICD-10-CM | POA: Diagnosis not present

## 2016-03-25 DIAGNOSIS — Z124 Encounter for screening for malignant neoplasm of cervix: Secondary | ICD-10-CM | POA: Diagnosis not present

## 2016-03-25 DIAGNOSIS — Z6825 Body mass index (BMI) 25.0-25.9, adult: Secondary | ICD-10-CM | POA: Diagnosis not present

## 2016-03-25 LAB — RESULTS CONSOLE HPV: CHL HPV: NEGATIVE

## 2016-03-25 LAB — HM PAP SMEAR: HM Pap smear: NEGATIVE

## 2016-07-18 ENCOUNTER — Telehealth: Payer: Self-pay | Admitting: Family Medicine

## 2016-07-18 DIAGNOSIS — Z0000 Encounter for general adult medical examination without abnormal findings: Secondary | ICD-10-CM

## 2016-07-18 NOTE — Telephone Encounter (Signed)
Pt has a cpe scheduled with you on Aug. 9th and would like to go ahead and get her blood work done before if possible. Thanks

## 2016-07-18 NOTE — Telephone Encounter (Signed)
Labs placed and faxed.Kaitlyn Knox

## 2016-07-18 NOTE — Telephone Encounter (Signed)
Please order CMP, lipids, TSH, and CBC no diff

## 2016-08-09 DIAGNOSIS — Z713 Dietary counseling and surveillance: Secondary | ICD-10-CM | POA: Diagnosis not present

## 2016-08-11 ENCOUNTER — Ambulatory Visit (INDEPENDENT_AMBULATORY_CARE_PROVIDER_SITE_OTHER): Payer: BLUE CROSS/BLUE SHIELD | Admitting: Family Medicine

## 2016-08-11 ENCOUNTER — Encounter: Payer: Self-pay | Admitting: Family Medicine

## 2016-08-11 VITALS — BP 111/73 | HR 56 | Resp 16 | Ht 62.5 in | Wt 144.0 lb

## 2016-08-11 DIAGNOSIS — R768 Other specified abnormal immunological findings in serum: Secondary | ICD-10-CM

## 2016-08-11 DIAGNOSIS — Z0000 Encounter for general adult medical examination without abnormal findings: Secondary | ICD-10-CM | POA: Diagnosis not present

## 2016-08-11 DIAGNOSIS — L989 Disorder of the skin and subcutaneous tissue, unspecified: Secondary | ICD-10-CM

## 2016-08-11 DIAGNOSIS — Z1159 Encounter for screening for other viral diseases: Secondary | ICD-10-CM

## 2016-08-11 NOTE — Progress Notes (Signed)
Subjective:     Kaitlyn Knox is a 56 y.o. female and is here for a comprehensive physical exam. The patient reports no problems. She has her mammogram scheduled. She says her Pap smear is up-to-date with her OB/GYN.   She does exercise almost daily. She does yoga, hiking, and water aerobics.  He does have a lesion on her nose today that she would like me to look at. She has had significant sun exposure over her skin over the years this is more recently she has quit tanning her face.  Social History   Social History  . Marital status: Divorced    Spouse name: N/A  . Number of children: N/A  . Years of education: N/A   Occupational History  . Not on file.   Social History Main Topics  . Smoking status: Former Smoker    Packs/day: 1.00    Types: Cigarettes    Quit date: 08/17/2012  . Smokeless tobacco: Never Used  . Alcohol use Not on file  . Drug use: Unknown  . Sexual activity: Not on file   Other Topics Concern  . Not on file   Social History Narrative  . No narrative on file   Health Maintenance  Topic Date Due  . Hepatitis C Screening  [birth year]  . HIV Screening  05/16/1975  . PAP SMEAR  10/03/2015  . MAMMOGRAM  01/01/2016  . INFLUENZA VACCINE  08/03/2016  . COLONOSCOPY  03/05/2018  . TETANUS/TDAP  04/20/2022    The following portions of the patient's history were reviewed and updated as appropriate: allergies, current medications, past family history, past medical history, past social history, past surgical history and problem list.  Review of Systems A comprehensive review of systems was negative.   Objective:    BP 111/73   Pulse (!) 56   Resp 16   Wt 144 lb (65.3 kg)   SpO2 98%   BMI 25.51 kg/m  General appearance: alert, cooperative and appears stated age Head: Normocephalic, without obvious abnormality, atraumatic Eyes: conj clear, EOMI< PEERLA Ears: normal TM's and external ear canals both ears Nose: Nares normal. Septum midline. Mucosa  normal. No drainage or sinus tenderness. Throat: lips, mucosa, and tongue normal; teeth and gums normal Neck: no adenopathy, no carotid bruit, no JVD, supple, symmetrical, trachea midline and thyroid not enlarged, symmetric, no tenderness/mass/nodules Back: symmetric, no curvature. ROM normal. No CVA tenderness. Lungs: clear to auscultation bilaterally Heart: regular rate and rhythm, S1, S2 normal, no murmur, click, rub or gallop Abdomen: soft, non-tender; bowel sounds normal; no masses,  no organomegaly Extremities: extremities normal, atraumatic, no cyanosis or edema Pulses: 2+ and symmetric Skin: she has a small oval shaped lesion approx o.5 cm on the left side of her nose near the bridge that is papular and scaling Lymph nodes: Cervical adenopathy: nl and Axillary adenopathy: not done Neurologic: Alert and oriented X 3, normal strength and tone. Normal symmetric reflexes. Normal coordination and gait    Assessment:    Healthy female exam.     Plan:     See After Visit Summary for Counseling Recommendations   Keep up a regular exercise program and make sure you are eating a healthy diet Try to eat 4 servings of dairy a day, or if you are lactose intolerant take a calcium with vitamin D daily.  Your vaccines are up to date.   Atypical skin lesion on right nasal bridge-suspect either a squamous cell since it does have a little  bit of crusting that also has a little bit of a shiny appearance along the edges secured also be a basal cell but concerning for skin cancer. Recommend referral to dermatology for further treatment and excision.

## 2016-08-11 NOTE — Patient Instructions (Signed)
Keep up a regular exercise program and make sure you are eating a healthy diet Try to eat 4 servings of dairy a day, or if you are lactose intolerant take a calcium with vitamin D daily.  See Handout on Shingrix.

## 2016-08-19 DIAGNOSIS — Z0000 Encounter for general adult medical examination without abnormal findings: Secondary | ICD-10-CM | POA: Diagnosis not present

## 2016-08-19 DIAGNOSIS — Z1159 Encounter for screening for other viral diseases: Secondary | ICD-10-CM | POA: Diagnosis not present

## 2016-08-19 LAB — CBC
HCT: 43.1 % (ref 35.0–45.0)
Hemoglobin: 14.3 g/dL (ref 11.7–15.5)
MCH: 31.1 pg (ref 27.0–33.0)
MCHC: 33.2 g/dL (ref 32.0–36.0)
MCV: 93.7 fL (ref 80.0–100.0)
MPV: 10.8 fL (ref 7.5–12.5)
PLATELETS: 213 10*3/uL (ref 140–400)
RBC: 4.6 MIL/uL (ref 3.80–5.10)
RDW: 12.9 % (ref 11.0–15.0)
WBC: 3.6 10*3/uL — ABNORMAL LOW (ref 3.8–10.8)

## 2016-08-19 LAB — TSH: TSH: 0.7 m[IU]/L

## 2016-08-20 LAB — COMPLETE METABOLIC PANEL WITH GFR
ALBUMIN: 4.1 g/dL (ref 3.6–5.1)
ALT: 15 U/L (ref 6–29)
AST: 19 U/L (ref 10–35)
Alkaline Phosphatase: 51 U/L (ref 33–130)
BILIRUBIN TOTAL: 0.4 mg/dL (ref 0.2–1.2)
BUN: 19 mg/dL (ref 7–25)
CO2: 23 mmol/L (ref 20–32)
Calcium: 8.9 mg/dL (ref 8.6–10.4)
Chloride: 107 mmol/L (ref 98–110)
Creat: 0.82 mg/dL (ref 0.50–1.05)
GFR, EST NON AFRICAN AMERICAN: 80 mL/min (ref >=60)
GLUCOSE: 91 mg/dL (ref 65–99)
Potassium: 4.5 mmol/L (ref 3.5–5.3)
SODIUM: 143 mmol/L (ref 135–146)
Total Protein: 6.5 g/dL (ref 6.1–8.1)

## 2016-08-20 LAB — LIPID PANEL W/REFLEX DIRECT LDL
Cholesterol: 201 mg/dL — ABNORMAL HIGH (ref <200)
HDL: 74 mg/dL (ref >50)
LDL-CHOLESTEROL: 107 mg/dL — AB
Non-HDL Cholesterol (Calc): 127 mg/dL (ref <130)
Total CHOL/HDL Ratio: 2.7 Ratio (ref <5.0)
Triglycerides: 107 mg/dL (ref <150)

## 2016-08-20 LAB — VITAMIN D 25 HYDROXY (VIT D DEFICIENCY, FRACTURES): VIT D 25 HYDROXY: 42 ng/mL (ref 30–100)

## 2016-08-20 LAB — HEPATITIS C ANTIBODY: HCV Ab: REACTIVE — AB

## 2016-08-23 ENCOUNTER — Telehealth: Payer: Self-pay

## 2016-08-23 DIAGNOSIS — D729 Disorder of white blood cells, unspecified: Secondary | ICD-10-CM | POA: Diagnosis not present

## 2016-08-23 LAB — HEPATITIS C RNA QUANTITATIVE
HCV QUANT LOG: 1.18 {Log_IU}/mL — AB
HCV Quantitative: 15 IU/mL — ABNORMAL HIGH

## 2016-08-23 NOTE — Telephone Encounter (Signed)
Kaitlyn Knox called and states she Dermatology cannot see her until December 5 th. She wanted to make sure it is ok to wait that long. Please advise.

## 2016-08-24 DIAGNOSIS — R768 Other specified abnormal immunological findings in serum: Secondary | ICD-10-CM | POA: Diagnosis not present

## 2016-08-24 MED ORDER — IMIQUIMOD 5 % EX CREA: TOPICAL_CREAM | CUTANEOUS | 0 refills | Status: DC

## 2016-08-24 NOTE — Telephone Encounter (Signed)
OK, want her to try a topical treatment. Sometimes this works really well on small lesions and actually gets rid of it without necessarily having to cut the tissue out. Just apply a small amount of the lesion itself. Do not rub over the entire nose. It will call some redness and irritation. In fact that means it's actually working to destroy the tissue. She's going to apply it 3 times a week at bedtime for example on Monday, Wednesday and Friday evenings. She will do that for 3 months total. That way when she gets in with dermatology in December they can take a look at the area. If it still needs to be biopsied they can remove it at that time.

## 2016-08-24 NOTE — Addendum Note (Signed)
Addended by: Beatrice Lecher D on: 08/24/2016 03:12 PM   Modules accepted: Orders

## 2016-08-25 LAB — CBC WITH DIFFERENTIAL/PLATELET
BASOS PCT: 0 %
Basophils Absolute: 0 cells/uL (ref 0–200)
Eosinophils Absolute: 51 cells/uL (ref 15–500)
Eosinophils Relative: 1 %
HEMATOCRIT: 41.7 % (ref 35.0–45.0)
HEMOGLOBIN: 13.9 g/dL (ref 11.7–15.5)
LYMPHS ABS: 1326 {cells}/uL (ref 850–3900)
LYMPHS PCT: 26 %
MCH: 31 pg (ref 27.0–33.0)
MCHC: 33.3 g/dL (ref 32.0–36.0)
MCV: 92.9 fL (ref 80.0–100.0)
MONO ABS: 459 {cells}/uL (ref 200–950)
MPV: 10.4 fL (ref 7.5–12.5)
Monocytes Relative: 9 %
Neutro Abs: 3264 cells/uL (ref 1500–7800)
Neutrophils Relative %: 64 %
Platelets: 220 10*3/uL (ref 140–400)
RBC: 4.49 MIL/uL (ref 3.80–5.10)
RDW: 13 % (ref 11.0–15.0)
WBC: 5.1 10*3/uL (ref 3.8–10.8)

## 2016-08-26 ENCOUNTER — Telehealth: Payer: Self-pay

## 2016-08-26 LAB — ACUTE HEP PANEL AND HEP B SURFACE AB
HCV Ab: REACTIVE — AB
HEP A IGM: NONREACTIVE
HEP B C IGM: NONREACTIVE
HEP B S AB: NONREACTIVE
HEP B S AG: NONREACTIVE

## 2016-08-26 LAB — HIV ANTIBODY (ROUTINE TESTING W REFLEX): HIV 1&2 Ab, 4th Generation: NONREACTIVE

## 2016-08-26 NOTE — Telephone Encounter (Signed)
Pre Authorization was sent to Cover My Meds for imiquimod. Key: [id number] - Rx #: [id number] Additional information was needed.

## 2016-08-29 LAB — HEPATITIS C RNA QUANTITATIVE
HCV QUANT LOG: NOT DETECTED {Log_IU}/mL
HCV Quantitative: <15 IU/mL

## 2016-08-30 LAB — HEPATITIS C GENOTYPE: HCV Genotype: NOT DETECTED

## 2016-09-01 ENCOUNTER — Telehealth: Payer: Self-pay | Admitting: Family Medicine

## 2016-09-01 NOTE — Telephone Encounter (Signed)
Received fax from [facility] and they denied coverage on Imiquimod due to they only approve for members over 18 for the treatment of actinic keratosis on face or scalp and superficial basal cell carcinoma when the member has normal Immune system. Will put in providers box. - CF

## 2016-09-02 ENCOUNTER — Ambulatory Visit (HOSPITAL_COMMUNITY): Payer: BLUE CROSS/BLUE SHIELD

## 2016-09-07 ENCOUNTER — Encounter: Payer: Self-pay | Admitting: Family Medicine

## 2016-09-07 NOTE — Telephone Encounter (Signed)
Medication was sent for PA and was denied. Is there another medication she can use. She has an appointment in December with Dermatology.

## 2016-09-07 NOTE — Telephone Encounter (Signed)
Call pt: topical not approve by her insurance. Let call another Derm office and see if we can get her in sooner.  Can try calling [facility] in [place] [address].

## 2016-09-07 NOTE — Telephone Encounter (Signed)
I actually had just put in a new phone note. The plan is to try to get her in with a new dermatologist. Please see if Jenny Reichmann can call [facility] dermatology and see if they would be able to get her in sooner.

## 2016-09-07 NOTE — Telephone Encounter (Signed)
Left message for a return call

## 2016-09-08 NOTE — Telephone Encounter (Signed)
Routing for review

## 2016-09-08 NOTE — Telephone Encounter (Signed)
Addressed in separate phone note.

## 2016-09-14 ENCOUNTER — Other Ambulatory Visit: Payer: Self-pay | Admitting: Family Medicine

## 2016-09-21 DIAGNOSIS — L570 Actinic keratosis: Secondary | ICD-10-CM | POA: Diagnosis not present

## 2016-09-21 DIAGNOSIS — D225 Melanocytic nevi of trunk: Secondary | ICD-10-CM | POA: Diagnosis not present

## 2016-09-21 DIAGNOSIS — C44311 Basal cell carcinoma of skin of nose: Secondary | ICD-10-CM | POA: Diagnosis not present

## 2016-09-21 DIAGNOSIS — L814 Other melanin hyperpigmentation: Secondary | ICD-10-CM | POA: Diagnosis not present

## 2016-09-21 DIAGNOSIS — D492 Neoplasm of unspecified behavior of bone, soft tissue, and skin: Secondary | ICD-10-CM | POA: Diagnosis not present

## 2016-10-03 ENCOUNTER — Encounter: Payer: Self-pay | Admitting: Family Medicine

## 2016-10-03 DIAGNOSIS — C4491 Basal cell carcinoma of skin, unspecified: Secondary | ICD-10-CM | POA: Insufficient documentation

## 2016-10-06 DIAGNOSIS — M8588 Other specified disorders of bone density and structure, other site: Secondary | ICD-10-CM | POA: Diagnosis not present

## 2016-10-06 DIAGNOSIS — M85852 Other specified disorders of bone density and structure, left thigh: Secondary | ICD-10-CM | POA: Diagnosis not present

## 2016-10-06 DIAGNOSIS — M859 Disorder of bone density and structure, unspecified: Secondary | ICD-10-CM | POA: Diagnosis not present

## 2016-10-06 DIAGNOSIS — Z13820 Encounter for screening for osteoporosis: Secondary | ICD-10-CM | POA: Diagnosis not present

## 2016-10-06 DIAGNOSIS — Z1231 Encounter for screening mammogram for malignant neoplasm of breast: Secondary | ICD-10-CM | POA: Diagnosis not present

## 2016-10-07 LAB — HM DEXA SCAN

## 2016-10-10 ENCOUNTER — Ambulatory Visit (INDEPENDENT_AMBULATORY_CARE_PROVIDER_SITE_OTHER): Payer: BLUE CROSS/BLUE SHIELD | Admitting: Family Medicine

## 2016-10-10 VITALS — BP 110/61 | HR 64 | Temp 98.0°F

## 2016-10-10 DIAGNOSIS — Z23 Encounter for immunization: Secondary | ICD-10-CM | POA: Diagnosis not present

## 2016-10-10 LAB — HM MAMMOGRAPHY

## 2016-10-10 NOTE — Progress Notes (Signed)
Pt came into clinic today for shingles vaccine. Pt tolerated injection in left deltoid well, no immediate complications. Pt advised of common reactions and advised to contact clinic with any concerns. Verbalized understanding. No further questions.

## 2016-10-10 NOTE — Progress Notes (Signed)
Agree with above.  Kaitlyn Metheney, MD  

## 2016-10-14 ENCOUNTER — Ambulatory Visit: Payer: BLUE CROSS/BLUE SHIELD

## 2016-10-25 DIAGNOSIS — C44311 Basal cell carcinoma of skin of nose: Secondary | ICD-10-CM | POA: Diagnosis not present

## 2016-11-21 DIAGNOSIS — C44311 Basal cell carcinoma of skin of nose: Secondary | ICD-10-CM | POA: Diagnosis not present

## 2017-01-10 ENCOUNTER — Ambulatory Visit (INDEPENDENT_AMBULATORY_CARE_PROVIDER_SITE_OTHER): Payer: BLUE CROSS/BLUE SHIELD | Admitting: Family Medicine

## 2017-01-10 VITALS — BP 101/61 | HR 59 | Temp 97.7°F

## 2017-01-10 DIAGNOSIS — Z23 Encounter for immunization: Secondary | ICD-10-CM | POA: Diagnosis not present

## 2017-01-10 NOTE — Progress Notes (Signed)
Pt came into clinic today for second and final shingles vaccine. Pt reports no negative side effects from the first injection, aside from soreness at injection site. Pt tolerated injection in left deltoid well, no immediate complications. Advised to contact clinic with any questions or concerns.

## 2017-01-10 NOTE — Progress Notes (Signed)
Agree with documentation as above.   Vaishnavi Dalby, MD  

## 2017-03-28 DIAGNOSIS — Z01419 Encounter for gynecological examination (general) (routine) without abnormal findings: Secondary | ICD-10-CM | POA: Diagnosis not present

## 2017-03-28 DIAGNOSIS — Z6826 Body mass index (BMI) 26.0-26.9, adult: Secondary | ICD-10-CM | POA: Diagnosis not present

## 2017-07-21 ENCOUNTER — Telehealth: Payer: Self-pay

## 2017-07-21 NOTE — Telephone Encounter (Signed)
Kaitlyn Knox wants to talk with [name] about her parent's appointment next week.

## 2017-09-18 ENCOUNTER — Telehealth: Payer: Self-pay

## 2017-09-18 DIAGNOSIS — Z0000 Encounter for general adult medical examination without abnormal findings: Secondary | ICD-10-CM

## 2017-09-18 DIAGNOSIS — D729 Disorder of white blood cells, unspecified: Secondary | ICD-10-CM

## 2017-09-18 DIAGNOSIS — E041 Nontoxic single thyroid nodule: Secondary | ICD-10-CM

## 2017-09-18 NOTE — Telephone Encounter (Signed)
Requesting lab orders be sent to lab so she can have them done on 09-19-17 in preparation for her physical on 09-26-17.  Labs pended, please review and sign if appropriate  Thanks!

## 2017-09-19 NOTE — Telephone Encounter (Signed)
Mammograms done at [facility] in [place]. Next mammogram scheduled "sometime within the next month"

## 2017-09-19 NOTE — Telephone Encounter (Signed)
Left msg advising labs ordered and asked for call back concerning mammogram info

## 2017-09-19 NOTE — Telephone Encounter (Signed)
Perfect! Can you abstract the one from last year in [facility]

## 2017-09-19 NOTE — Telephone Encounter (Signed)
Labs printed.  Please see where she goes for her mammo too so we can get that faxed and ordered if needed.

## 2017-09-20 ENCOUNTER — Encounter: Payer: Self-pay | Admitting: Family Medicine

## 2017-09-20 NOTE — Telephone Encounter (Signed)
2018 Mammo and DEXA abstracted

## 2017-09-22 LAB — COMPREHENSIVE METABOLIC PANEL
AG Ratio: 2 (calc) (ref 1.0–2.5)
ALBUMIN MSPROF: 4.3 g/dL (ref 3.6–5.1)
ALT: 14 U/L (ref 6–29)
AST: 19 U/L (ref 10–35)
Alkaline phosphatase (APISO): 54 U/L (ref 33–130)
BUN: 14 mg/dL (ref 7–25)
CHLORIDE: 107 mmol/L (ref 98–110)
CO2: 28 mmol/L (ref 20–32)
CREATININE: 0.82 mg/dL (ref 0.50–1.05)
Calcium: 9.3 mg/dL (ref 8.6–10.4)
GLOBULIN: 2.1 g/dL (ref 1.9–3.7)
GLUCOSE: 93 mg/dL (ref 65–99)
POTASSIUM: 4.4 mmol/L (ref 3.5–5.3)
Sodium: 141 mmol/L (ref 135–146)
Total Bilirubin: 0.5 mg/dL (ref 0.2–1.2)
Total Protein: 6.4 g/dL (ref 6.1–8.1)

## 2017-09-22 LAB — LIPID PANEL W/REFLEX DIRECT LDL
CHOL/HDL RATIO: 3 (calc) (ref <5.0)
CHOLESTEROL: 215 mg/dL — AB (ref <200)
HDL: 72 mg/dL (ref >50)
LDL CHOLESTEROL (CALC): 120 mg/dL — AB
NON-HDL CHOLESTEROL (CALC): 143 mg/dL — AB (ref <130)
Triglycerides: 123 mg/dL (ref <150)

## 2017-09-22 LAB — CBC WITH DIFFERENTIAL/PLATELET
BASOS ABS: 20 {cells}/uL (ref 0–200)
Basophils Relative: 0.5 %
EOS ABS: 60 {cells}/uL (ref 15–500)
Eosinophils Relative: 1.5 %
HEMATOCRIT: 42 % (ref 35.0–45.0)
HEMOGLOBIN: 14.3 g/dL (ref 11.7–15.5)
LYMPHS ABS: 1244 {cells}/uL (ref 850–3900)
MCH: 30.8 pg (ref 27.0–33.0)
MCHC: 34 g/dL (ref 32.0–36.0)
MCV: 90.5 fL (ref 80.0–100.0)
MPV: 10.9 fL (ref 7.5–12.5)
Monocytes Relative: 9.5 %
Neutro Abs: 2296 cells/uL (ref 1500–7800)
Neutrophils Relative %: 57.4 %
Platelets: 201 10*3/uL (ref 140–400)
RBC: 4.64 10*6/uL (ref 3.80–5.10)
RDW: 11.9 % (ref 11.0–15.0)
Total Lymphocyte: 31.1 %
WBC: 4 10*3/uL (ref 3.8–10.8)
WBCMIX: 380 {cells}/uL (ref 200–950)

## 2017-09-22 LAB — TSH: TSH: 0.74 mIU/L (ref 0.40–4.50)

## 2017-09-26 ENCOUNTER — Ambulatory Visit (INDEPENDENT_AMBULATORY_CARE_PROVIDER_SITE_OTHER): Payer: BLUE CROSS/BLUE SHIELD | Admitting: Family Medicine

## 2017-09-26 ENCOUNTER — Encounter: Payer: Self-pay | Admitting: Family Medicine

## 2017-09-26 VITALS — BP 124/77 | HR 68 | Ht 63.0 in | Wt 150.0 lb

## 2017-09-26 DIAGNOSIS — Z23 Encounter for immunization: Secondary | ICD-10-CM

## 2017-09-26 DIAGNOSIS — L570 Actinic keratosis: Secondary | ICD-10-CM | POA: Diagnosis not present

## 2017-09-26 DIAGNOSIS — Z0000 Encounter for general adult medical examination without abnormal findings: Secondary | ICD-10-CM

## 2017-09-26 MED ORDER — IMIQUIMOD 5 % EX CREA: TOPICAL_CREAM | CUTANEOUS | 2 refills | Status: DC

## 2017-09-26 NOTE — Patient Instructions (Addendum)
Restart your calcium with vitamin D  Health Maintenance, Female Adopting a healthy lifestyle and getting preventive care can go a long way to promote health and wellness. Talk with your health care provider about what schedule of regular examinations is right for you. This is a good chance for you to check in with your provider about disease prevention and staying healthy. In between checkups, there are plenty of things you can do on your own. Experts have done a lot of research about which lifestyle changes and preventive measures are most likely to keep you healthy. Ask your health care provider for more information. Weight and diet Eat a healthy diet  Be sure to include plenty of vegetables, fruits, low-fat dairy products, and lean protein.  Do not eat a lot of foods high in solid fats, added sugars, or salt.  Get regular exercise. This is one of the most important things you can do for your health. ? Most adults should exercise for at least 150 minutes each week. The exercise should increase your heart rate and make you sweat (moderate-intensity exercise). ? Most adults should also do strengthening exercises at least twice a week. This is in addition to the moderate-intensity exercise.  Maintain a healthy weight  Body mass index (BMI) is a measurement that can be used to identify possible weight problems. It estimates body fat based on height and weight. Your health care provider can help determine your BMI and help you achieve or maintain a healthy weight.  For females 51 years of age and older: ? A BMI below 18.5 is considered underweight. ? A BMI of 18.5 to 24.9 is normal. ? A BMI of 25 to 29.9 is considered overweight. ? A BMI of 30 and above is considered obese.  Watch levels of cholesterol and blood lipids  You should start having your blood tested for lipids and cholesterol at 57 years of age, then have this test every 5 years.  You may need to have your cholesterol levels  checked more often if: ? Your lipid or cholesterol levels are high. ? You are older than 57 years of age. ? You are at high risk for heart disease.  Cancer screening Lung Cancer  Lung cancer screening is recommended for adults 52-26 years old who are at high risk for lung cancer because of a history of smoking.  A yearly low-dose CT scan of the lungs is recommended for people who: ? Currently smoke. ? Have quit within the past 15 years. ? Have at least a 30-pack-year history of smoking. A pack year is smoking an average of one pack of cigarettes a day for 1 year.  Yearly screening should continue until it has been 15 years since you quit.  Yearly screening should stop if you develop a health problem that would prevent you from having lung cancer treatment.  Breast Cancer  Practice breast self-awareness. This means understanding how your breasts normally appear and feel.  It also means doing regular breast self-exams. Let your health care provider know about any changes, no matter how small.  If you are in your 20s or 30s, you should have a clinical breast exam (CBE) by a health care provider every 1-3 years as part of a regular health exam.  If you are 49 or older, have a CBE every year. Also consider having a breast X-ray (mammogram) every year.  If you have a family history of breast cancer, talk to your health care provider about genetic screening.  If you are at high risk for breast cancer, talk to your health care provider about having an MRI and a mammogram every year.  Breast cancer gene (BRCA) assessment is recommended for women who have family members with BRCA-related cancers. BRCA-related cancers include: ? Breast. ? Ovarian. ? Tubal. ? Peritoneal cancers.  Results of the assessment will determine the need for genetic counseling and BRCA1 and BRCA2 testing.  Cervical Cancer Your health care provider may recommend that you be screened regularly for cancer of the  pelvic organs (ovaries, uterus, and vagina). This screening involves a pelvic examination, including checking for microscopic changes to the surface of your cervix (Pap test). You may be encouraged to have this screening done every 3 years, beginning at age 71.  For women ages 82-65, health care providers may recommend pelvic exams and Pap testing every 3 years, or they may recommend the Pap and pelvic exam, combined with testing for human papilloma virus (HPV), every 5 years. Some types of HPV increase your risk of cervical cancer. Testing for HPV may also be done on women of any age with unclear Pap test results.  Other health care providers may not recommend any screening for nonpregnant women who are considered low risk for pelvic cancer and who do not have symptoms. Ask your health care provider if a screening pelvic exam is right for you.  If you have had past treatment for cervical cancer or a condition that could lead to cancer, you need Pap tests and screening for cancer for at least 20 years after your treatment. If Pap tests have been discontinued, your risk factors (such as having a new sexual partner) need to be reassessed to determine if screening should resume. Some women have medical problems that increase the chance of getting cervical cancer. In these cases, your health care provider may recommend more frequent screening and Pap tests.  Colorectal Cancer  This type of cancer can be detected and often prevented.  Routine colorectal cancer screening usually begins at 57 years of age and continues through 57 years of age.  Your health care provider may recommend screening at an earlier age if you have risk factors for colon cancer.  Your health care provider may also recommend using home test kits to check for hidden blood in the stool.  A small camera at the end of a tube can be used to examine your colon directly (sigmoidoscopy or colonoscopy). This is done to check for the  earliest forms of colorectal cancer.  Routine screening usually begins at age 15.  Direct examination of the colon should be repeated every 5-10 years through 57 years of age. However, you may need to be screened more often if early forms of precancerous polyps or small growths are found.  Skin Cancer  Check your skin from head to toe regularly.  Tell your health care provider about any new moles or changes in moles, especially if there is a change in a mole's shape or color.  Also tell your health care provider if you have a mole that is larger than the size of a pencil eraser.  Always use sunscreen. Apply sunscreen liberally and repeatedly throughout the day.  Protect yourself by wearing long sleeves, pants, a wide-brimmed hat, and sunglasses whenever you are outside.  Heart disease, diabetes, and high blood pressure  High blood pressure causes heart disease and increases the risk of stroke. High blood pressure is more likely to develop in: ? People who have blood  pressure in the high end of the normal range (130-139/85-89 mm Hg). ? People who are overweight or obese. ? People who are African American.  If you are 56-87 years of age, have your blood pressure checked every 3-5 years. If you are 74 years of age or older, have your blood pressure checked every year. You should have your blood pressure measured twice-once when you are at a hospital or clinic, and once when you are not at a hospital or clinic. Record the average of the two measurements. To check your blood pressure when you are not at a hospital or clinic, you can use: ? An automated blood pressure machine at a pharmacy. ? A home blood pressure monitor.  If you are between 69 years and 79 years old, ask your health care provider if you should take aspirin to prevent strokes.  Have regular diabetes screenings. This involves taking a blood sample to check your fasting blood sugar level. ? If you are at a normal weight and  have a low risk for diabetes, have this test once every three years after 57 years of age. ? If you are overweight and have a high risk for diabetes, consider being tested at a younger age or more often. Preventing infection Hepatitis B  If you have a higher risk for hepatitis B, you should be screened for this virus. You are considered at high risk for hepatitis B if: ? You were born in a country where hepatitis B is common. Ask your health care provider which countries are considered high risk. ? Your parents were born in a high-risk country, and you have not been immunized against hepatitis B (hepatitis B vaccine). ? You have HIV or AIDS. ? You use needles to inject street drugs. ? You live with someone who has hepatitis B. ? You have had sex with someone who has hepatitis B. ? You get hemodialysis treatment. ? You take certain medicines for conditions, including cancer, organ transplantation, and autoimmune conditions.  Hepatitis C  Blood testing is recommended for: ? Everyone born from [birth year] through 1965. ? Anyone with known risk factors for hepatitis C.  Sexually transmitted infections (STIs)  You should be screened for sexually transmitted infections (STIs) including gonorrhea and chlamydia if: ? You are sexually active and are younger than 57 years of age. ? You are older than 57 years of age and your health care provider tells you that you are at risk for this type of infection. ? Your sexual activity has changed since you were last screened and you are at an increased risk for chlamydia or gonorrhea. Ask your health care provider if you are at risk.  If you do not have HIV, but are at risk, it may be recommended that you take a prescription medicine daily to prevent HIV infection. This is called pre-exposure prophylaxis (PrEP). You are considered at risk if: ? You are sexually active and do not regularly use condoms or know the HIV status of your partner(s). ? You take drugs by  injection. ? You are sexually active with a partner who has HIV.  Talk with your health care provider about whether you are at high risk of being infected with HIV. If you choose to begin PrEP, you should first be tested for HIV. You should then be tested every 3 months for as long as you are taking PrEP. Pregnancy  If you are premenopausal and you may become pregnant, ask your health care provider about preconception  counseling.  If you may become pregnant, take 400 to 800 micrograms (mcg) of folic acid every day.  If you want to prevent pregnancy, talk to your health care provider about birth control (contraception). Osteoporosis and menopause  Osteoporosis is a disease in which the bones lose minerals and strength with aging. This can result in serious bone fractures. Your risk for osteoporosis can be identified using a bone density scan.  If you are 54 years of age or older, or if you are at risk for osteoporosis and fractures, ask your health care provider if you should be screened.  Ask your health care provider whether you should take a calcium or vitamin D supplement to lower your risk for osteoporosis.  Menopause may have certain physical symptoms and risks.  Hormone replacement therapy may reduce some of these symptoms and risks. Talk to your health care provider about whether hormone replacement therapy is right for you. Follow these instructions at home:  Schedule regular health, dental, and eye exams.  Stay current with your immunizations.  Do not use any tobacco products including cigarettes, chewing tobacco, or electronic cigarettes.  If you are pregnant, do not drink alcohol.  If you are breastfeeding, limit how much and how often you drink alcohol.  Limit alcohol intake to no more than 1 drink per day for nonpregnant women. One drink equals 12 ounces of beer, 5 ounces of wine, or 1 ounces of hard liquor.  Do not use street drugs.  Do not share needles.  Ask  your health care provider for help if you need support or information about quitting drugs.  Tell your health care provider if you often feel depressed.  Tell your health care provider if you have ever been abused or do not feel safe at home. This information is not intended to replace advice given to you by your health care provider. Make sure you discuss any questions you have with your health care provider. Document Released: 07/05/2010 Document Revised: 05/28/2015 Document Reviewed: 09/23/2014 Elsevier Interactive Patient Education  [employer].

## 2017-09-26 NOTE — Progress Notes (Signed)
Subjective:     Kaitlyn Knox is a 57 y.o. female and is here for a comprehensive physical exam. The patient reports no problems.  She is doing well overall.  She does have a pink skin lesion on her left upper outer arm that she would like me to look at today.  Is not painful itchy or bothersome but she just noticed it.  Social History   Socioeconomic History  . Marital status: Divorced    Spouse name: Not on file  . Number of children: Not on file  . Years of education: Not on file  . Highest education level: Not on file  Occupational History  . Not on file  Social Needs  . Financial resource strain: Not on file  . Food insecurity:    Worry: Not on file    Inability: Not on file  . Transportation needs:    Medical: Not on file    Non-medical: Not on file  Tobacco Use  . Smoking status: Former Smoker    Packs/day: 1.00    Types: Cigarettes    Last attempt to quit: 08/17/2012    Years since quitting: 5.1  . Smokeless tobacco: Never Used  Substance and Sexual Activity  . Alcohol use: Not on file  . Drug use: Not on file  . Sexual activity: Not on file  Lifestyle  . Physical activity:    Days per week: Not on file    Minutes per session: Not on file  . Stress: Not on file  Relationships  . Social connections:    Talks on phone: Not on file    Gets together: Not on file    Attends religious service: Not on file    Active member of club or organization: Not on file    Attends meetings of clubs or organizations: Not on file    Relationship status: Not on file  . Intimate partner violence:    Fear of current or ex partner: Not on file    Emotionally abused: Not on file    Physically abused: Not on file    Forced sexual activity: Not on file  Other Topics Concern  . Not on file  Social History Narrative  . Not on file   Health Maintenance  Topic Date Due  . INFLUENZA VACCINE  08/03/2017  . COLONOSCOPY  03/05/2018  . MAMMOGRAM  10/11/2018  . PAP SMEAR  03/26/2019   . TETANUS/TDAP  04/20/2022  . Hepatitis C Screening  Completed  . HIV Screening  Completed    The following portions of the patient's history were reviewed and updated as appropriate: allergies, current medications, past family history, past medical history, past social history, past surgical history and problem list.  Review of Systems A comprehensive review of systems was negative.   Objective:    BP 124/77   Pulse 68   Ht 5\' 3"  (1.6 m)   Wt 150 lb (68 kg)   SpO2 100%   BMI 26.57 kg/m  General appearance: alert, cooperative and appears stated age Head: Normocephalic, without obvious abnormality, atraumatic Eyes: conj clear, EOMI, PEERLA Ears: normal TM's and external ear canals both ears Nose: Nares normal. Septum midline. Mucosa normal. No drainage or sinus tenderness. Throat: lips, mucosa, and tongue normal; teeth and gums normal Neck: no adenopathy, no carotid bruit, no JVD, supple, symmetrical, trachea midline and thyroid not enlarged, symmetric, no tenderness/mass/nodules Back: symmetric, no curvature. ROM normal. No CVA tenderness. Lungs: clear to auscultation bilaterally Heart:  regular rate and rhythm, S1, S2 normal, no murmur, click, rub or gallop Abdomen: soft, non-tender; bowel sounds normal; no masses,  no organomegaly Extremities: extremities normal, atraumatic, no cyanosis or edema Pulses: 2+ and symmetric Skin: She has an erythematous skin lesion on the left upper outer arm approximately 1.2 cm in diameter it has a little bit of scale around the edge. Lymph nodes: Cervical adenopathy: nl and Supraclavicular adenopathy: nl Neurologic: Alert and oriented X 3, normal strength and tone. Normal symmetric reflexes. Normal coordination and gait    Assessment:    Healthy female exam.      Plan:     See After Visit Summary for Counseling Recommendations   Keep up a regular exercise program and make sure you are eating a healthy diet Try to eat 4 servings of dairy  a day, or if you are lactose intolerant take a calcium with vitamin D daily.  Your vaccines are up to date.   Skin lesion on left upper outer arm-most consistent with an actinic keratosis.  A trial of a medical mood applying 2 times per week at bedtime for 16 weeks.  Did warn patient about irritation.

## 2017-09-27 ENCOUNTER — Telehealth: Payer: Self-pay | Admitting: Family Medicine

## 2017-09-27 NOTE — Telephone Encounter (Signed)
Approvedtoday 09/27/2017.  Effective from 09/27/2017 through 01/16/2018. Pharmacy aware.

## 2017-10-08 ENCOUNTER — Encounter: Payer: Self-pay | Admitting: Family Medicine

## 2017-10-21 ENCOUNTER — Other Ambulatory Visit: Payer: Self-pay | Admitting: Family Medicine

## 2017-10-24 NOTE — Telephone Encounter (Signed)
Dr Madilyn Fireman has not filled in more than a year. Please advise.

## 2017-10-24 NOTE — Telephone Encounter (Signed)
Patient advised of recommendations.  

## 2017-10-24 NOTE — Telephone Encounter (Signed)
Follow up with Dr Madilyn Fireman if needed if not better.

## 2017-12-31 ENCOUNTER — Encounter: Payer: Self-pay | Admitting: Family Medicine

## 2018-03-27 ENCOUNTER — Ambulatory Visit (INDEPENDENT_AMBULATORY_CARE_PROVIDER_SITE_OTHER): Payer: No Typology Code available for payment source

## 2018-03-27 ENCOUNTER — Ambulatory Visit (INDEPENDENT_AMBULATORY_CARE_PROVIDER_SITE_OTHER): Payer: No Typology Code available for payment source | Admitting: Family Medicine

## 2018-03-27 ENCOUNTER — Encounter: Payer: Self-pay | Admitting: Family Medicine

## 2018-03-27 ENCOUNTER — Other Ambulatory Visit: Payer: Self-pay

## 2018-03-27 VITALS — BP 111/75 | HR 71 | Ht 63.0 in | Wt 151.0 lb

## 2018-03-27 DIAGNOSIS — M79671 Pain in right foot: Secondary | ICD-10-CM

## 2018-03-27 DIAGNOSIS — M7989 Other specified soft tissue disorders: Secondary | ICD-10-CM | POA: Diagnosis not present

## 2018-03-27 DIAGNOSIS — M25471 Effusion, right ankle: Secondary | ICD-10-CM | POA: Diagnosis not present

## 2018-03-27 NOTE — Progress Notes (Signed)
Acute Office Visit  Subjective:    Patient ID: Kaitlyn Knox, female    DOB: 29-Jan-1960, 58 y.o.   MRN: [id number]  Chief Complaint  Patient presents with  . Foot Pain    R foot x3 wks she reports that it has been swollen,aches,uncomfortable,she feels pressure on her toes. pain is 5/10 she has been doing epsom salt soaks and elevating her foot    HPI Patient is in today for right foot pain. She says has been going on for about 3 weeks. No trauma or injury per se. She has been been dancing at home for exercise about twice a day and more recently started that.  She has been noticing some swelling on the top of the foot and rates her pain a 5/10.  She has been soaking in epsom salts but has been really busy and hasn't really been able to elevated it much. Not taking any meds for it.   No past medical history on file.  Past Surgical History:  Procedure Laterality Date  . Bilateral Leg Lift  December 2012  . Tummy tuck  December 18th 2012    No family history on file.  Social History   Socioeconomic History  . Marital status: Divorced    Spouse name: Not on file  . Number of children: Not on file  . Years of education: Not on file  . Highest education level: Not on file  Occupational History  . Not on file  Social Needs  . Financial resource strain: Not on file  . Food insecurity:    Worry: Not on file    Inability: Not on file  . Transportation needs:    Medical: Not on file    Non-medical: Not on file  Tobacco Use  . Smoking status: Former Smoker    Packs/day: 1.00    Types: Cigarettes    Last attempt to quit: 08/17/2012    Years since quitting: 5.6  . Smokeless tobacco: Never Used  Substance and Sexual Activity  . Alcohol use: Not on file  . Drug use: Not on file  . Sexual activity: Not on file  Lifestyle  . Physical activity:    Days per week: Not on file    Minutes per session: Not on file  . Stress: Not on file  Relationships  . Social connections:   Talks on phone: Not on file    Gets together: Not on file    Attends religious service: Not on file    Active member of club or organization: Not on file    Attends meetings of clubs or organizations: Not on file    Relationship status: Not on file  . Intimate partner violence:    Fear of current or ex partner: Not on file    Emotionally abused: Not on file    Physically abused: Not on file    Forced sexual activity: Not on file  Other Topics Concern  . Not on file  Social History Narrative  . Not on file    Outpatient Medications Prior to Visit  Medication Sig Dispense Refill  . diphenhydrAMINE (BENADRYL) 25 mg capsule Take 25 mg by mouth every 6 (six) hours as needed.    . diphenoxylate-atropine (LOMOTIL) 2.5-0.025 MG tablet TAKE 1 TABLET BY MOUTH 4 TIMES A DAY 30 tablet 0  . norethindrone-ethinyl estradiol (FEMHRT 1/5) 1-5 MG-MCG TABS Take by mouth daily.    . imiquimod (ALDARA) 5 % cream Apply topically 2 (two) times  a week. X 16 weeks. 12 each 2   No facility-administered medications prior to visit.     Allergies  Allergen Reactions  . Ciprofloxacin     REACTION: Hives  . Penicillins     REACTION: Hives  . Sulfamethoxazole-Trimethoprim     ROS     Objective:    Physical Exam  Constitutional: She is oriented to person, place, and time. She appears well-developed and well-nourished.  HENT:  Head: Normocephalic and atraumatic.  Eyes: Conjunctivae and EOM are normal.  Cardiovascular: Normal rate.  Pulmonary/Chest: Effort normal.  Musculoskeletal:     Comments: Right foot withs swelling over the dital right foot. No bruising. Some swelling proximal to the medial ankle. Ankle with NROM.  Strength is 5/5.  Tender mostly over the distal 3rd, 4th, and 5th MT. Most tender over the dital 4th MT. Normal ROM of the toes.   Neurological: She is alert and oriented to person, place, and time.  Skin: Skin is dry. No pallor.  Psychiatric: She has a normal mood and affect. Her  behavior is normal.  Vitals reviewed.   BP 111/75   Pulse 71   Ht 5\' 3"  (1.6 m)   Wt 151 lb (68.5 kg)   SpO2 100%   BMI 26.75 kg/m  Wt Readings from Last 3 Encounters:  03/27/18 151 lb (68.5 kg)  09/26/17 150 lb (68 kg)  08/11/16 144 lb (65.3 kg)    Health Maintenance Due  Topic Date Due  . COLONOSCOPY  03/05/2018    There are no preventive care reminders to display for this patient.   Lab Results  Component Value Date   TSH 0.74 09/22/2017   Lab Results  Component Value Date   WBC 4.0 09/22/2017   HGB 14.3 09/22/2017   HCT 42.0 09/22/2017   MCV 90.5 09/22/2017   PLT 201 09/22/2017   Lab Results  Component Value Date   NA 141 09/22/2017   K 4.4 09/22/2017   CO2 28 09/22/2017   GLUCOSE 93 09/22/2017   BUN 14 09/22/2017   CREATININE 0.82 09/22/2017   BILITOT 0.5 09/22/2017   ALKPHOS 51 08/19/2016   AST 19 09/22/2017   ALT 14 09/22/2017   PROT 6.4 09/22/2017   ALBUMIN 4.1 08/19/2016   CALCIUM 9.3 09/22/2017   Lab Results  Component Value Date   CHOL 215 (H) 09/22/2017   Lab Results  Component Value Date   HDL 72 09/22/2017   Lab Results  Component Value Date   LDLCALC 120 (H) 09/22/2017   Lab Results  Component Value Date   TRIG 123 09/22/2017   Lab Results  Component Value Date   CHOLHDL 3.0 09/22/2017   No results found for: HGBA1C     Assessment & Plan:   Problem List Items Addressed This Visit    None    Visit Diagnoses    Right foot pain    -  Primary   Relevant Orders   DG Foot Complete Right (Completed)   DG Ankle Complete Right (Completed)   Right ankle swelling       Relevant Orders   DG Foot Complete Right (Completed)   DG Ankle Complete Right (Completed)     xrays are negative for fracture but she is having pain with ambulation. Will place in post op shoe for about 1-2 weeks and then advance as tolerateing. Ice and elevated. ACE wrap for compression of of swelling around the ankle. Suspect stress fracture.    No  orders of the defined types were placed in this encounter.    Beatrice Lecher, MD

## 2018-09-24 LAB — HM COLONOSCOPY

## 2018-11-01 LAB — HM MAMMOGRAPHY

## 2019-02-01 ENCOUNTER — Ambulatory Visit: Payer: No Typology Code available for payment source | Admitting: Family Medicine

## 2019-02-12 ENCOUNTER — Encounter: Payer: Self-pay | Admitting: Family Medicine

## 2019-02-12 ENCOUNTER — Other Ambulatory Visit: Payer: Self-pay

## 2019-02-12 ENCOUNTER — Ambulatory Visit (INDEPENDENT_AMBULATORY_CARE_PROVIDER_SITE_OTHER): Payer: No Typology Code available for payment source | Admitting: Family Medicine

## 2019-02-12 VITALS — BP 134/77 | HR 74 | Ht 63.0 in | Wt 144.0 lb

## 2019-02-12 DIAGNOSIS — M79671 Pain in right foot: Secondary | ICD-10-CM | POA: Diagnosis not present

## 2019-02-12 DIAGNOSIS — M79604 Pain in right leg: Secondary | ICD-10-CM | POA: Insufficient documentation

## 2019-02-12 DIAGNOSIS — L723 Sebaceous cyst: Secondary | ICD-10-CM | POA: Diagnosis not present

## 2019-02-12 NOTE — Patient Instructions (Addendum)
Keep covered in the shower with a waterproof bandage for the next 4 days.  After that okay to get wet.  Just pat dry and apply a small dab of Vaseline daily.  Return in 7 to 10 days for suture removal.  At any point you are worried about how the wound looks or if it is draining after the first 4 days then please give [place] a call back and let us know.

## 2019-02-12 NOTE — Progress Notes (Signed)
Established Patient Office Visit  Subjective:  Patient ID: Kaitlyn Knox, female    DOB: December 06, 1960  Age: 59 y.o. MRN: [id number]  CC:  Chief Complaint  Patient presents with  . sebaceous cyst    on upper R back at shoulder (06/17/2013)  . Foot Pain    R foot    HPI Kaitlyn Knox presents for Recurrence of sebaceous cyst on her right upper back. Had it removed urgently about 5 years ago by [place].  It has slowly returned over the years. No currently painful. Slowly getting larger.   Has been having right distal foot pain for months. She tries to exercise on the treadmill several times a week walking about 3 miles and then on the weekend hikes for about 4 miles.  Says that part of her foot often swells.  Had a stress fracture and wore a boot back in March and that did help some.  The 2nd toe is starting to turn inward.  She has tried elevation and soak.  Has had persistant intermittant pain since then.    DG Foot Complete Right [id number] Resulted: 03/27/18 [id number], Result status: Final result Ordering provider: Hali Marry, MD 03/27/18 [phone] Resulted by: Kerby Moors, MD  Performed: 03/27/18 0840 - 03/27/18 0848 Accession number: [id number]  Resulting lab: [facility]  Narrative:  CLINICAL DATA: Three-week 6 of pain and swelling involving the  third, fourth, fifth metatarsals of the right foot.   EXAM:  RIGHT FOOT COMPLETE - 3+ VIEW   COMPARISON: None.   FINDINGS:  There is no evidence of fracture or dislocation. There is no  evidence of arthropathy or other focal bone abnormality. Soft  tissues are unremarkable.   IMPRESSION:  Negative.      No past medical history on file.  Past Surgical History:  Procedure Laterality Date  . Bilateral Leg Lift  December 2012  . Tummy tuck  December 18th 2012    No family history on file.  Social History   Socioeconomic History  . Marital status: Divorced    Spouse name: Not on file  . Number of  children: Not on file  . Years of education: Not on file  . Highest education level: Not on file  Occupational History  . Not on file  Tobacco Use  . Smoking status: Former Smoker    Packs/day: 1.00    Types: Cigarettes    Quit date: 08/17/2012    Years since quitting: 6.4  . Smokeless tobacco: Never Used  Substance and Sexual Activity  . Alcohol use: Not on file  . Drug use: Not on file  . Sexual activity: Not on file  Other Topics Concern  . Not on file  Social History Narrative  . Not on file   Social Determinants of Health   Financial Resource Strain:   . Difficulty of Paying Living Expenses: Not on file  Food Insecurity:   . Worried About Charity fundraiser in the Last Year: Not on file  . Ran Out of Food in the Last Year: Not on file  Transportation Needs:   . Lack of Transportation (Medical): Not on file  . Lack of Transportation (Non-Medical): Not on file  Physical Activity:   . Days of Exercise per Week: Not on file  . Minutes of Exercise per Session: Not on file  Stress:   . Feeling of Stress : Not on file  Social Connections:   . Frequency of Communication with Friends  and Family: Not on file  . Frequency of Social Gatherings with Friends and Family: Not on file  . Attends Religious Services: Not on file  . Active Member of Clubs or Organizations: Not on file  . Attends Archivist Meetings: Not on file  . Marital Status: Not on file  Intimate Partner Violence:   . Fear of Current or Ex-Partner: Not on file  . Emotionally Abused: Not on file  . Physically Abused: Not on file  . Sexually Abused: Not on file    Outpatient Medications Prior to Visit  Medication Sig Dispense Refill  . norethindrone-ethinyl estradiol (FEMHRT 1/5) 1-5 MG-MCG TABS Take by mouth daily.    . diphenhydrAMINE (BENADRYL) 25 mg capsule Take 25 mg by mouth every 6 (six) hours as needed.    . diphenoxylate-atropine (LOMOTIL) 2.5-0.025 MG tablet TAKE 1 TABLET BY MOUTH 4 TIMES  A DAY 30 tablet 0   No facility-administered medications prior to visit.    Allergies  Allergen Reactions  . Ciprofloxacin     REACTION: Hives  . Penicillins     REACTION: Hives  . Sulfamethoxazole-Trimethoprim     ROS Review of Systems    Objective:    Physical Exam  Constitutional: She is oriented to person, place, and time. She appears well-developed and well-nourished.  HENT:  Head: Normocephalic and atraumatic.  Eyes: Conjunctivae and EOM are normal.  Cardiovascular: Normal rate.  Pulmonary/Chest: Effort normal.  Musculoskeletal:     Comments: Right foot with tenderness at the MT head on the 2nd, 3rd, and 4th MTs.  The 2nd toes is drifting towards the great toe.  Dec flexion of the 2nd toe.  Popping when moving the 4th and 5th MT head against each other.    Neurological: She is alert and oriented to person, place, and time.  Skin: Skin is dry. No pallor.  approx 1.5-2 cm seb cyst along scar on the upper right shoulder.   Psychiatric: She has a normal mood and affect. Her behavior is normal.  Vitals reviewed.   BP 134/77   Pulse 74   Ht 5\' 3"  (1.6 m)   Wt 144 lb (65.3 kg)   SpO2 99%   BMI 25.51 kg/m  Wt Readings from Last 3 Encounters:  02/12/19 144 lb (65.3 kg)  03/27/18 151 lb (68.5 kg)  09/26/17 150 lb (68 kg)     Health Maintenance Due  Topic Date Due  . COLONOSCOPY  03/05/2018  . INFLUENZA VACCINE  08/04/2018  . MAMMOGRAM  10/11/2018  . PAP SMEAR-Modifier  03/26/2019    There are no preventive care reminders to display for this patient.  Lab Results  Component Value Date   TSH 0.74 09/22/2017   Lab Results  Component Value Date   WBC 4.0 09/22/2017   HGB 14.3 09/22/2017   HCT 42.0 09/22/2017   MCV 90.5 09/22/2017   PLT 201 09/22/2017   Lab Results  Component Value Date   NA 141 09/22/2017   K 4.4 09/22/2017   CO2 28 09/22/2017   GLUCOSE 93 09/22/2017   BUN 14 09/22/2017   CREATININE 0.82 09/22/2017   BILITOT 0.5 09/22/2017    ALKPHOS 51 08/19/2016   AST 19 09/22/2017   ALT 14 09/22/2017   PROT 6.4 09/22/2017   ALBUMIN 4.1 08/19/2016   CALCIUM 9.3 09/22/2017   Lab Results  Component Value Date   CHOL 215 (H) 09/22/2017   Lab Results  Component Value Date   HDL 72 09/22/2017  Lab Results  Component Value Date   LDLCALC 120 (H) 09/22/2017   Lab Results  Component Value Date   TRIG 123 09/22/2017   Lab Results  Component Value Date   CHOLHDL 3.0 09/22/2017   No results found for: HGBA1C    Assessment & Plan:   Problem List Items Addressed This Visit      Other   Right foot pain - Primary    Discussed adding more support to the metatarsal head with metatarsal pads. Wear in shoes for 2 weeks. Showed her wihere to place them.  If not helpful then can refer to Dr. Darene Lamer or podiatry.  She has some degeneration at the base of the 2nd toe.         Other Visit Diagnoses    Sebaceous cyst         Sebaceous cyst - discussed options. She would prefer full excision.  See note below.    No orders of the defined types were placed in this encounter.   Follow-up: Return in about 8 days (around 02/20/2019) for suture removal .   Sebaceous Cyst Excision Procedure Note  Pre-operative Diagnosis: Epidermal cyst,  Post-operative Diagnosis: same  Locations:right upper back  Indications: irritation.   Anesthesia: Lidocaine 1% with epinephrine without added sodium bicarbonate  Procedure Details  Patient informed of the risks (including bleeding and infection) and benefits of the  procedure and Verbal informed consent obtained.  The lesion and surrounding area was given a sterile prep using chlorhexidine and draped in the usual sterile fashion. An incision was made over the cyst, which was dissected free of the surrounding tissue and removed.  The cyst was filled with typical sebaceous material.  The wound was closed with 3-0 Silk using simple interrupted stitches. Antibiotic ointment and a sterile  dressing applied.  The specimen was sent for pathologic examination. The patient tolerated the procedure well.  [id number] blood  Findings:seb cyst  Condition: Stable  Complications: none.  Plan: 1. Instructed to keep the wound dry and covered for 24-48h and clean thereafter. 2. Warning signs of infection were reviewed.   3. Recommended that the patient use OTC acetaminophen as needed for pain.  4. Return for suture removal in 8 days. Beatrice Lecher, MD

## 2019-02-12 NOTE — Assessment & Plan Note (Signed)
Discussed adding more support to the metatarsal head with metatarsal pads. Wear in shoes for 2 weeks. Showed her wihere to place them.  If not helpful then can refer to Dr. Darene Lamer or podiatry.  She has some degeneration at the base of the 2nd toe.

## 2019-02-21 ENCOUNTER — Ambulatory Visit: Payer: No Typology Code available for payment source

## 2019-02-22 ENCOUNTER — Ambulatory Visit: Payer: No Typology Code available for payment source

## 2019-02-22 ENCOUNTER — Ambulatory Visit (INDEPENDENT_AMBULATORY_CARE_PROVIDER_SITE_OTHER): Payer: No Typology Code available for payment source | Admitting: Family Medicine

## 2019-02-22 ENCOUNTER — Other Ambulatory Visit: Payer: Self-pay

## 2019-02-22 ENCOUNTER — Encounter: Payer: Self-pay | Admitting: Family Medicine

## 2019-02-22 DIAGNOSIS — Z4802 Encounter for removal of sutures: Secondary | ICD-10-CM

## 2019-02-22 NOTE — Progress Notes (Signed)
Patient presents to office for suture removal. Provider looked at stitches and agreed that OK remove. Patient had some redness around incision but no signs of infection. Patient has not had any difficulties and does not have any complaints as far as home care for wound.   All stitches removed successfully. Ointment and bandage applied. Patient instructed on home care. Provider evaluated incision site prior to patient leaving.

## 2019-02-22 NOTE — Progress Notes (Signed)
Agree with documentation as above. Some mild erythema around wound margins and there is some yellow soft exudate in the center.  Continue to apply vaseline BID x 2-4 weeks.  OK to keep uncovered.    Beatrice Lecher, MD

## 2019-03-28 ENCOUNTER — Encounter: Payer: Self-pay | Admitting: Family Medicine

## 2019-04-15 ENCOUNTER — Encounter: Payer: Self-pay | Admitting: Family Medicine

## 2019-04-15 NOTE — Telephone Encounter (Signed)
Routing to provider  

## 2019-04-15 NOTE — Telephone Encounter (Signed)
Please schedule her with Dr. Dianah Field.  Please also see if she is gone for her mammogram so that we can get her chart updated.

## 2019-04-16 ENCOUNTER — Telehealth: Payer: Self-pay | Admitting: *Deleted

## 2019-04-16 NOTE — Telephone Encounter (Signed)
appt scheduled for 4/20 @ 830 am w/Dr. Darene Lamer. Pt has had her mammogram/and colonoscopy done this has been printed and placed in box for updating.

## 2019-04-22 ENCOUNTER — Encounter: Payer: Self-pay | Admitting: Family Medicine

## 2019-04-23 ENCOUNTER — Encounter: Payer: Self-pay | Admitting: Sports Medicine

## 2019-04-23 ENCOUNTER — Ambulatory Visit (INDEPENDENT_AMBULATORY_CARE_PROVIDER_SITE_OTHER): Payer: No Typology Code available for payment source | Admitting: Sports Medicine

## 2019-04-23 ENCOUNTER — Other Ambulatory Visit: Payer: Self-pay

## 2019-04-23 DIAGNOSIS — M79604 Pain in right leg: Secondary | ICD-10-CM | POA: Diagnosis not present

## 2019-04-23 NOTE — Assessment & Plan Note (Signed)
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 X-rays were unremarkable. On exam she has pain at the posterior medial tibia consistent with medial tibial stress syndrome versus tibial stress fracture. I would like her to wear a cam boot for at least the next month and we are going to go ahead and get an MRI of her tib/fib. Return to see me after the MRI to go over results.

## 2019-04-23 NOTE — Progress Notes (Signed)
    Procedures performed today:    None.  Independent interpretation of notes and tests performed by another provider:   None.  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 X-rays were unremarkable. On exam she has pain at the posterior medial tibia consistent with medial tibial stress syndrome versus tibial stress fracture. I would like her to wear a cam boot for at least the next month and we are going to go ahead and get an MRI of her tib/fib. Return to see me after the MRI to go over results.    ___________________________________________ Gwen Her. Dianah Field, M.D., ABFM., CAQSM. Primary Care and [place] Instructor of [place] of [facility]

## 2019-05-05 ENCOUNTER — Other Ambulatory Visit: Payer: Self-pay

## 2019-05-05 ENCOUNTER — Ambulatory Visit (INDEPENDENT_AMBULATORY_CARE_PROVIDER_SITE_OTHER): Payer: No Typology Code available for payment source

## 2019-05-05 DIAGNOSIS — M79604 Pain in right leg: Secondary | ICD-10-CM

## 2019-06-14 ENCOUNTER — Encounter: Payer: Self-pay | Admitting: Family Medicine

## 2019-06-18 ENCOUNTER — Ambulatory Visit (INDEPENDENT_AMBULATORY_CARE_PROVIDER_SITE_OTHER): Payer: No Typology Code available for payment source | Admitting: Sports Medicine

## 2019-06-18 ENCOUNTER — Encounter: Payer: Self-pay | Admitting: Sports Medicine

## 2019-06-18 ENCOUNTER — Other Ambulatory Visit: Payer: Self-pay

## 2019-06-18 ENCOUNTER — Ambulatory Visit (INDEPENDENT_AMBULATORY_CARE_PROVIDER_SITE_OTHER): Payer: No Typology Code available for payment source

## 2019-06-18 DIAGNOSIS — M75100 Unspecified rotator cuff tear or rupture of unspecified shoulder, not specified as traumatic: Secondary | ICD-10-CM | POA: Insufficient documentation

## 2019-06-18 DIAGNOSIS — M7541 Impingement syndrome of right shoulder: Secondary | ICD-10-CM

## 2019-06-18 MED ORDER — MELOXICAM 15 MG PO TABS: ORAL_TABLET | ORAL | 3 refills | Status: DC

## 2019-06-18 NOTE — Progress Notes (Signed)
    Procedures performed today:    Procedure: Real-time Ultrasound Guided injection of the right subacromial bursa Device: Samsung HS60  Verbal informed consent obtained.  Time-out conducted.  Noted no overlying erythema, induration, or other signs of local infection.  Skin prepped in a sterile fashion.  Local anesthesia: Topical Ethyl chloride.  With sterile technique and under real time ultrasound guidance: 1 cc Kenalog 40, 1 cc lidocaine, 1 cc bupivacaine injected easily Completed without difficulty  Pain immediately resolved suggesting accurate placement of the medication.  Advised to call if fevers/chills, erythema, induration, drainage, or persistent bleeding.  Images permanently stored and available for review in the ultrasound unit.  Impression: Technically successful ultrasound guided injection.  Independent interpretation of notes and tests performed by another provider:   None.  Brief History, Exam, Impression, and Recommendations:    Impingement syndrome, shoulder, right This is a pleasant 59 year old female, she has been in the gym, doing overhead press, started to have pain over her deltoid, worse with overhead press. On exam she has a positive Hawkins sign, and weakness and pain to resisted external rotation. She is getting woken up from sleep, due to severe pain we performed a subacromial injection today, adding x-rays, meloxicam, formal physical therapy, return to see me in 6 weeks. MRI if no better.    ___________________________________________ Gwen Her. Dianah Field, M.D., ABFM., CAQSM. Primary Care and [place] Instructor of [place] of [facility]

## 2019-06-18 NOTE — Assessment & Plan Note (Signed)
This is a pleasant 60 year old female, she has been in the gym, doing overhead press, started to have pain over her deltoid, worse with overhead press. On exam she has a positive Hawkins sign, and weakness and pain to resisted external rotation. She is getting woken up from sleep, due to severe pain we performed a subacromial injection today, adding x-rays, meloxicam, formal physical therapy, return to see me in 6 weeks. MRI if no better.

## 2019-06-19 ENCOUNTER — Encounter: Payer: Self-pay | Admitting: Family Medicine

## 2019-06-19 NOTE — Telephone Encounter (Signed)
It has been scanned into chart.

## 2019-06-27 ENCOUNTER — Encounter: Payer: Self-pay | Admitting: Physical Therapy

## 2019-06-27 ENCOUNTER — Other Ambulatory Visit: Payer: Self-pay

## 2019-06-27 ENCOUNTER — Ambulatory Visit (INDEPENDENT_AMBULATORY_CARE_PROVIDER_SITE_OTHER): Payer: No Typology Code available for payment source | Admitting: Physical Therapy

## 2019-06-27 DIAGNOSIS — M25511 Pain in right shoulder: Secondary | ICD-10-CM

## 2019-06-27 NOTE — Patient Instructions (Signed)
Access Code: [email or web address] URL: [email or web address] Date: 06/27/2019 Prepared by: Almyra Free  Exercises Doorway Pec Stretch at 90 Degrees Abduction - 1 x daily - 7 x weekly - 3 reps - 1 sets - 30-60 seconds hold Sleeper Stretch - 2 x daily - 7 x weekly - 1 sets - 3 reps - 30-60 sec hold Seated Scapular Retraction - 1 x daily - 7 x weekly - 10 reps - 1-33 sets - 2-3 sec hold Standing Shoulder Internal Rotation with Anchored Resistance - 1 x daily - 7 x weekly - 3 sets - 10 reps  Patient Education Trigger Point Dry Needling

## 2019-06-27 NOTE — Therapy (Signed)
[facility] [place], [place], [address] Phone: [phone]   Fax:  [phone]  Physical Therapy Evaluation  Patient Details  Name: Kaitlyn Knox MRN: [id number] Date of Birth: 1960/07/21 Referring Provider (PT): Dr. Dianah Field   Encounter Date: 06/27/2019   PT End of Session - 06/27/19 0801    Visit Number 1    Number of Visits 12    Date for PT Re-Evaluation 08/08/19    Authorization Type [employer]    PT Start Time 0801    PT Stop Time 0843    PT Time Calculation (min) 42 min    Activity Tolerance Patient tolerated treatment well    Behavior During Therapy [facility] for tasks assessed/performed           History reviewed. No pertinent past medical history.  Past Surgical History:  Procedure Laterality Date  . Bilateral Leg Lift  December 2012  . Tummy tuck  December 18th 2012    There were no vitals filed for this visit.    Subjective Assessment - 06/27/19 0803    Subjective Returned to gym in March and shoulder started hurting. OH press activites are the worse. Injection on 06/18/19. This helped with pain with sleeping. she has stopped doing OH lifting.    Pertinent History broke rt shoulder when she was 9    Diagnostic tests xray - some narrowing of A/C joint    Patient Stated Goals be able to have full range without pain    Currently in Pain? No/denies   5/10 with OH and resisted flexion             [facility] PT Assessment - 06/27/19 0001      Assessment   Medical Diagnosis impingement syndrome rt shoulder    Referring Provider (PT) Dr. Dianah Field    Onset Date/Surgical Date 03/04/19    Hand Dominance Right    Next MD Visit July       Precautions   Precautions None      Restrictions   Weight Bearing Restrictions No      Balance Screen   Has the patient fallen in the past 6 months No    Has the patient had a decrease in activity level because of a fear of falling?  No    Is the  patient reluctant to leave their home because of a fear of falling?  No      Home Ecologist residence      Prior Function   Level of Independence Independent    Vocation Full time employment    Vocation Requirements works with [employer]    Leisure works out regularly with [employer]      Posture/Postural Control   Posture Comments right shoulder/scapula depressed      ROM / Strength   AROM / PROM / Strength AROM;Strength;PROM      AROM   AROM Assessment Site Shoulder    Right/Left Shoulder Right    Right Shoulder Flexion 174 Degrees    Right Shoulder ABduction 163 Degrees    Right Shoulder Internal Rotation 58 Degrees    Right Shoulder External Rotation 90 Degrees      PROM   PROM Assessment Site Shoulder    Right/Left Shoulder Right    Right Shoulder Flexion 180 Degrees    Right Shoulder ABduction 180 Degrees    Right Shoulder Internal Rotation 70 Degrees      Strength   Overall  Strength Comments right mid/low traps 4/5    Strength Assessment Site Shoulder    Right/Left Shoulder Right    Right Shoulder Flexion 5/5    Right Shoulder Extension 5/5    Right Shoulder ABduction 5/5    Right Shoulder Internal Rotation 4+/5    Right Shoulder External Rotation 5/5                      Objective measurements completed on examination: See above findings.               PT Education - 06/27/19 1005    Education Details HEP/ DN ED    Person(s) Educated Patient    Methods Explanation;Demonstration;Handout    Comprehension Verbalized understanding;Returned demonstration               PT Long Term Goals - 06/27/19 1015      PT LONG TERM GOAL #1   Title Ind with HEP for strength and flexilbilty to maintain good posture.    Time 6    Period Weeks    Status New    Target Date 08/08/19      PT LONG TERM GOAL #2   Title Patient to demo right shoulder IR strength of 5/5 to prevent further injury.    Time 6     Period Weeks    Status New      PT LONG TERM GOAL #3   Title Patient able to perform OH lifting at home and in the gym without pain in the right shoulder.    Time 6    Period Weeks    Status New      PT LONG TERM GOAL #4   Title Improved FOTO score limitations to 25% or less.    Baseline 33% limitations.    Time 6    Period Weeks    Status New      PT LONG TERM GOAL #5   Title Patient to demo full AROM of right shoulder to normalize ADLS.    Time 6    Period Weeks    Status New                  Plan - 06/27/19 1006    Clinical Impression Statement Patient presents today with complaints of right shoulder pain mainly with overhead lifting. She injured her shoulder in March after returning to gym workouts. She has strength and ROM deficits with right shoulder IR. 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 She also has tightness and tenderness in her right pectorals and subscapularis. Patient would like to return to her full gym workout including OH presses as well as have painfree ADLs. She will benefit from PT to address her deficits and return to painfree use of the right shoulder.    Examination-Activity Limitations Lift    Stability/Clinical Decision Making Stable/Uncomplicated    Clinical Decision Making Low    Rehab Potential Excellent    PT Frequency 2x / week   starting with 1x/wk   PT Duration 6 weeks    PT Treatment/Interventions ADLs/Self Care Home Management;Cryotherapy;Electrical Stimulation;Iontophoresis 4mg /ml Dexamethasone;Moist Heat;Ultrasound;Neuromuscular re-education;Therapeutic exercise;Therapeutic activities;Patient/family education;Manual techniques;Dry needling;Spinal Manipulations;Joint Manipulations    PT Next Visit Plan review HEP and add mid/low traps; DN/manual to pectorals, subscap    PT Home Exercise Plan [facility]    Consulted and Agree with Plan of  Care Patient           Patient will benefit from skilled therapeutic intervention in order to improve the following deficits and impairments:  Increased muscle spasms, Pain, Decreased range of motion, Impaired flexibility, Decreased strength  Visit Diagnosis: Acute pain of right shoulder - Plan: PT plan of care cert/re-cert     Problem List Patient Active Problem List   Diagnosis Date Noted  . Impingement syndrome, shoulder, right 06/18/2019  . Pain in right leg 02/12/2019  . Basal cell carcinoma 10/03/2016  . Osteopenia 08/31/2011  . DEPRESSION, MILD 10/01/2009  . FATIGUE 10/01/2009  . THYROID NODULE 04/13/2009  . GERD 10/08/2007     Madelyn Flavors PT 06/27/2019, 1:11 PM  [facility] [place], [place], [address] Phone: [phone]   Fax:  [phone]  Name: Kaitlyn Knox MRN: [id number] Date of Birth: 1960-09-12

## 2019-07-04 ENCOUNTER — Other Ambulatory Visit: Payer: Self-pay

## 2019-07-04 ENCOUNTER — Ambulatory Visit (INDEPENDENT_AMBULATORY_CARE_PROVIDER_SITE_OTHER): Payer: No Typology Code available for payment source | Admitting: Physical Therapy

## 2019-07-04 ENCOUNTER — Encounter: Payer: Self-pay | Admitting: Physical Therapy

## 2019-07-04 DIAGNOSIS — M25511 Pain in right shoulder: Secondary | ICD-10-CM | POA: Diagnosis not present

## 2019-07-04 NOTE — Therapy (Signed)
[place], [place], [address] Phone: [phone]   Fax:  [phone]  Physical Therapy Treatment  Patient Details  Name: Kaitlyn Knox MRN: [id number] Date of Birth: 12/10/1960 Referring Provider (PT): Dr. Dianah Field   Encounter Date: 07/04/2019   PT End of Session - 07/04/19 0804    Visit Number 2    Number of Visits 12    Date for PT Re-Evaluation 08/08/19    Authorization Type [employer]    PT Start Time 0801    PT Stop Time 0843    PT Time Calculation (min) 42 min    Activity Tolerance Patient tolerated treatment well    Behavior During Therapy [facility] for tasks assessed/performed           History reviewed. No pertinent past medical history.  Past Surgical History:  Procedure Laterality Date  . Bilateral Leg Lift  December 2012  . Tummy tuck  December 18th 2012    There were no vitals filed for this visit.   Subjective Assessment - 07/04/19 0804    Subjective No pain today. Couldn't remember what to do with the band.    Diagnostic tests xray - some narrowing of A/C joint    Patient Stated Goals be able to have full range without pain    Currently in Pain? No/denies                             [facility] Adult PT Treatment/Exercise - 07/04/19 0001      Exercises   Exercises Shoulder      Shoulder Exercises: Supine   Protraction Right;20 reps;Weights    Protraction Weight (lbs) 6      Shoulder Exercises: Seated   Elevation Right;5 reps    Elevation Weight (lbs) 6#    Elevation Limitations feels pain; tested after manual therapy; no pain without resistance      Shoulder Exercises: Prone   Horizontal ABduction 1 Right;20 reps;Weights    Horizontal ABduction 1 Weight (lbs) 2    Horizontal ABduction 2 Right;20 reps;Weights    Horizontal ABduction 2 Weight (lbs) 2      Shoulder Exercises: Standing   Internal Rotation Right;15 reps;Theraband    Theraband Level  (Shoulder Internal Rotation) Level 3 (Green)    Internal Rotation Limitations went to red due to pain       Shoulder Exercises: ROM/Strengthening   UBE (Upper Arm Bike) L1 x 4 min 64fwd/2bwd      Shoulder Exercises: Stretch   Corner Stretch 2 reps;30 seconds    Corner Stretch Limitations in doorway 90/90    Other Shoulder Stretches sleeper stretch 2x30 sec      Manual Therapy   Manual Therapy Soft tissue mobilization    Manual therapy comments skilled palpation and monitoring of soft tissues during DN    Soft tissue mobilization to right parascapular muscles in prone            Trigger Point Dry Needling - 07/04/19 0001    Consent Given? Yes    Education Handout Provided Yes    Muscles Treated Upper Quadrant Infraspinatus;Subscapularis;Latissimus dorsi    Dry Needling Comments right    Infraspinatus Response Twitch response elicited;Palpable increased muscle length    Subscapularis Response Palpable increased muscle length    Latissimus dorsi Response Palpable increased muscle length  PT Long Term Goals - 06/27/19 1015      PT LONG TERM GOAL #1   Title Ind with HEP for strength and flexilbilty to maintain good posture.    Time 6    Period Weeks    Status New    Target Date 08/08/19      PT LONG TERM GOAL #2   Title Patient to demo right shoulder IR strength of 5/5 to prevent further injury.    Time 6    Period Weeks    Status New      PT LONG TERM GOAL #3   Title Patient able to perform OH lifting at home and in the gym without pain in the right shoulder.    Time 6    Period Weeks    Status New      PT LONG TERM GOAL #4   Title Improved FOTO score limitations to 25% or less.    Baseline 33% limitations.    Time 6    Period Weeks    Status New      PT LONG TERM GOAL #5   Title Patient to demo full AROM of right shoulder to normalize ADLS.    Time 6    Period Weeks    Status New                 Plan - 07/04/19 1004     Clinical Impression Statement Patient tolerated TE fairly well. She had some referral into deltoid region in last minute of UBE, with IR using green band and with OH press with resistance. She had excellent response to DN and manual therapy in left UQ. She is only partilally compliant with HEP. Encouraged to do sleeper stretch and band.    PT Frequency 2x / week    PT Duration 6 weeks    PT Treatment/Interventions ADLs/Self Care Home Management;Cryotherapy;Electrical Stimulation;Iontophoresis 4mg /ml Dexamethasone;Moist Heat;Ultrasound;Neuromuscular re-education;Therapeutic exercise;Therapeutic activities;Patient/family education;Manual techniques;Dry needling;Spinal Manipulations;Joint Manipulations    PT Next Visit Plan Assess DN and continue as indicated; pec stretch and mobility on foam roller; add horizontal ABD with band; serratus ant strengthening    PT Home Exercise Plan [facility]           Patient will benefit from skilled therapeutic intervention in order to improve the following deficits and impairments:  Increased muscle spasms, Pain, Decreased range of motion, Impaired flexibility, Decreased strength  Visit Diagnosis: Acute pain of right shoulder     Problem List Patient Active Problem List   Diagnosis Date Noted  . Impingement syndrome, shoulder, right 06/18/2019  . Pain in right leg 02/12/2019  . Basal cell carcinoma 10/03/2016  . Osteopenia 08/31/2011  . DEPRESSION, MILD 10/01/2009  . FATIGUE 10/01/2009  . THYROID NODULE 04/13/2009  . GERD 10/08/2007    Madelyn Flavors PT 07/04/2019, 10:08 AM  [facility] [place], [place], [address] Phone: [phone]   Fax:  [phone]  Name: Kaitlyn Knox MRN: [id number] Date of Birth: 07/09/1960

## 2019-07-11 ENCOUNTER — Encounter: Payer: No Typology Code available for payment source | Admitting: Physical Therapy

## 2019-07-18 ENCOUNTER — Ambulatory Visit (INDEPENDENT_AMBULATORY_CARE_PROVIDER_SITE_OTHER): Payer: No Typology Code available for payment source | Admitting: Rehabilitative and Restorative Service Providers"

## 2019-07-18 ENCOUNTER — Other Ambulatory Visit: Payer: Self-pay

## 2019-07-18 ENCOUNTER — Encounter: Payer: Self-pay | Admitting: Rehabilitative and Restorative Service Providers"

## 2019-07-18 DIAGNOSIS — M25511 Pain in right shoulder: Secondary | ICD-10-CM

## 2019-07-18 NOTE — Therapy (Addendum)
[place], [place], [address] Phone: [phone]   Fax:  [phone]  Physical Therapy Treatment  Patient Details  Name: Kaitlyn Knox MRN: [id number] Date of Birth: 27-Oct-1960 Referring Provider (PT): Dr. Dianah Field   Encounter Date: 07/18/2019   PT End of Session - 07/18/19 0804    Visit Number 3    Number of Visits 12    Date for PT Re-Evaluation 08/08/19    PT Start Time 0802    PT Stop Time 0851    PT Time Calculation (min) 49 min    Activity Tolerance Patient tolerated treatment well           History reviewed. No pertinent past medical history.  Past Surgical History:  Procedure Laterality Date  . Bilateral Leg Lift  December 2012  . Tummy tuck  December 18th 2012    There were no vitals filed for this visit.   Subjective Assessment - 07/18/19 0808    Subjective Patient reports that she is shoulder is imporving. She has some discomfort with some activities and avoids certain movements in the gym    Currently in Pain? No/denies              [facility] PT Assessment - 07/18/19 0001      Assessment   Medical Diagnosis impingement syndrome rt shoulder    Referring Provider (PT) Dr. Dianah Field    Onset Date/Surgical Date 03/04/19    Hand Dominance Right    Next MD Visit July       Palpation   Palpation comment muscular tightness through the Rt pecs; anterior shoulder; upper trpa; leveator; teres; lats                          [facility] Adult PT Treatment/Exercise - 07/18/19 0001      Self-Care   Self-Care --   education/demonstration of sitting modifications w/noodle     Neuro Re-ed    Neuro Re-ed Details  postural correction engagning posterior shoulder girdle musculature       Shoulder Exercises: Standing   Other Standing Exercises scap squeeze 10 sec x 10; L's x 10; W'x x 10 with foam roll along spine       Shoulder Exercises: Stretch   Other Shoulder  Stretches 3 positions doorway stretch 30 sec x 2 reps each position       Moist Heat Therapy   Number Minutes Moist Heat 10 Minutes    Moist Heat Location Shoulder   thoracic      Manual Therapy   Manual therapy comments skilled palpation and monitoring of soft tissues during DN    Soft tissue mobilization pt supine - deep tissue work through the pecs/anterior chest; periscapular musculature; teres; lats    Scapular Mobilization Rt     Passive ROM Rt shoulder flexion; ER; IR             Trigger Point Dry Needling - 07/18/19 0001    Consent Given? Yes    Education Handout Provided Previously provided    Dry Needling Comments right    Pectoralis Major Response Palpable increased muscle length;Twitch response elicited    Pectoralis Minor Response Palpable increased muscle length;Twitch response elicited    Teres major Response Palpable increased muscle length    Teres minor Response Palpable increased muscle length                PT Education -  07/18/19 0825    Education Details HEP postural education    Person(s) Educated Patient    Methods Explanation;Demonstration;Tactile cues;Verbal cues;Handout    Comprehension Verbalized understanding;Returned demonstration;Verbal cues required;Tactile cues required               PT Long Term Goals - 06/27/19 1015      PT LONG TERM GOAL #1   Title Ind with HEP for strength and flexilbilty to maintain good posture.    Time 6    Period Weeks    Status New    Target Date 08/08/19      PT LONG TERM GOAL #2   Title Patient to demo right shoulder IR strength of 5/5 to prevent further injury.    Time 6    Period Weeks    Status New      PT LONG TERM GOAL #3   Title Patient able to perform OH lifting at home and in the gym without pain in the right shoulder.    Time 6    Period Weeks    Status New      PT LONG TERM GOAL #4   Title Improved FOTO score limitations to 25% or less.    Baseline 33% limitations.    Time 6      Period Weeks    Status New      PT LONG TERM GOAL #5   Title Patient to demo full AROM of right shoulder to normalize ADLS.    Time 6    Period Weeks    Status New                 Plan - 07/18/19 2956    Clinical Impression Statement Continued improvement in shoulder pain and function. She has continued tightness in the Rt anterior shoulder/pecs. Working on posture and alignment in sitting and standing; suggestions for work. Added postural strengthening exercises without difficulty.Good response to DN and manual work    Rehab Potential Excellent    PT Frequency 2x / week    PT Duration 6 weeks    PT Treatment/Interventions ADLs/Self Care Home Management;Cryotherapy;Electrical Stimulation;Iontophoresis 4mg /ml Dexamethasone;Moist Heat;Ultrasound;Neuromuscular re-education;Therapeutic exercise;Therapeutic activities;Patient/family education;Manual techniques;Dry needling;Spinal Manipulations;Joint Manipulations    PT Next Visit Plan Assess DN and continue as indicated; pec stretch and mobility on foam roller; add horizontal ABD with band; serratus ant strengthening    PT Home Exercise Plan [email or web address]    Consulted and Agree with Plan of Care Patient           Patient will benefit from skilled therapeutic intervention in order to improve the following deficits and impairments:     Visit Diagnosis: Acute pain of right shoulder     Problem List Patient Active Problem List   Diagnosis Date Noted  . Impingement syndrome, shoulder, right 06/18/2019  . Pain in right leg 02/12/2019  . Basal cell carcinoma 10/03/2016  . Osteopenia 08/31/2011  . DEPRESSION, MILD 10/01/2009  . FATIGUE 10/01/2009  . THYROID NODULE 04/13/2009  . GERD 10/08/2007    [name] Nilda Simmer PT, MPH  07/18/2019, 6:15 PM  [facility] [place], [place], [address] Phone: [phone]   Fax:  [phone]  Name: Kaitlyn Knox MRN:  [id number] Date of Birth: 1960/03/04

## 2019-07-18 NOTE — Patient Instructions (Signed)
Access Code: [email or web address]: [email or web address]: 07/15/2021Prepared by: Duron Meister HoltExercises  Doorway Pec Stretch at 90 Degrees Abduction - 1 x daily - 7 x weekly - 3 reps - 1 sets - 30-60 seconds hold  Sleeper Stretch - 2 x daily - 7 x weekly - 1 sets - 3 reps - 30-60 sec hold  Seated Scapular Retraction - 1 x daily - 7 x weekly - 10 reps - 1-33 sets - 2-3 sec hold  Standing Shoulder Internal Rotation with Anchored Resistance - 1 x daily - 7 x weekly - 3 sets - 10 reps  Seated Scapular Retraction - 2 x daily - 7 x weekly - 1 sets - 10 reps - 10 sec hold  Shoulder External Rotation and Scapular Retraction - 2 x daily - 7 x weekly - 1 sets - 10 reps - 2-3 sec hold  Shoulder External Rotation in 45 Degrees Abduction - 2 x daily - 7 x weekly - 1 sets - 10 reps - 2-3 sec hold  Doorway Pec Stretch at 60 Degrees Abduction - 3 x daily - 7 x weekly - 3 reps - 1 sets  Doorway Pec Stretch at 90 Degrees Abduction - 3 x daily - 7 x weekly - 3 reps - 1 sets - 30 seconds hold  Doorway Pec Stretch at 120 Degrees Abduction - 3 x daily - 7 x weekly - 3 reps - 1 sets - 30 second hold hold  Hooklying Shoulder T - 2 x daily - 7 x weekly - 1 sets - 1 reps - 2-5 min hold

## 2019-07-19 ENCOUNTER — Telehealth: Payer: Self-pay | Admitting: Family Medicine

## 2019-07-19 NOTE — Telephone Encounter (Signed)
.  pap

## 2019-07-25 ENCOUNTER — Ambulatory Visit (INDEPENDENT_AMBULATORY_CARE_PROVIDER_SITE_OTHER): Payer: No Typology Code available for payment source | Admitting: Rehabilitative and Restorative Service Providers"

## 2019-07-25 ENCOUNTER — Other Ambulatory Visit: Payer: Self-pay

## 2019-07-25 ENCOUNTER — Encounter: Payer: Self-pay | Admitting: Rehabilitative and Restorative Service Providers"

## 2019-07-25 DIAGNOSIS — M25511 Pain in right shoulder: Secondary | ICD-10-CM | POA: Diagnosis not present

## 2019-07-25 NOTE — Patient Instructions (Signed)
Access Code: [email or web address]: [email or web address]: 07/22/2021Prepared by: Liora Myles HoltExercises  Doorway Pec Stretch at 90 Degrees Abduction - 1 x daily - 7 x weekly - 3 reps - 1 sets - 30-60 seconds hold  Sleeper Stretch - 2 x daily - 7 x weekly - 1 sets - 3 reps - 30-60 sec hold  Seated Scapular Retraction - 1 x daily - 7 x weekly - 10 reps - 1-33 sets - 2-3 sec hold  Standing Shoulder Internal Rotation with Anchored Resistance - 1 x daily - 7 x weekly - 3 sets - 10 reps  Seated Scapular Retraction - 2 x daily - 7 x weekly - 1 sets - 10 reps - 10 sec hold  Shoulder External Rotation and Scapular Retraction - 2 x daily - 7 x weekly - 1 sets - 10 reps - 2-3 sec hold  Shoulder External Rotation in 45 Degrees Abduction - 2 x daily - 7 x weekly - 1 sets - 10 reps - 2-3 sec hold  Doorway Pec Stretch at 60 Degrees Abduction - 3 x daily - 7 x weekly - 3 reps - 1 sets  Doorway Pec Stretch at 90 Degrees Abduction - 3 x daily - 7 x weekly - 3 reps - 1 sets - 30 seconds hold  Doorway Pec Stretch at 120 Degrees Abduction - 3 x daily - 7 x weekly - 3 reps - 1 sets - 30 second hold hold  Hooklying Shoulder T - 2 x daily - 7 x weekly - 1 sets - 1 reps - 2-5 min hold  Standing Shoulder External Rotation with Resistance - 2 x daily - 7 x weekly - 1-3 sets - 10 reps - 2-3 sec hold  Standing Bilateral Low Shoulder Row with Anchored Resistance - 2 x daily - 7 x weekly - 1-3 sets - 2-3 reps - 10 sec hold  Shoulder Extension with Resistance - 2 x daily - 7 x weekly - 1-3 sets - 10 reps - 2-3 sec hold  Shoulder ER Stretch in Abduction - 2 x daily - 7 x weekly - 1 sets - 3 reps - 30 sec hold

## 2019-07-25 NOTE — Therapy (Signed)
[place], [place], [address] Phone: [phone]   Fax:  [phone]  Physical Therapy Treatment  Patient Details  Name: Kaitlyn Knox MRN: [id number] Date of Birth: 07-10-1960 Referring Provider (PT): Dr. Dianah Field   Encounter Date: 07/25/2019   PT End of Session - 07/25/19 0804    Visit Number 4    Number of Visits 12    Date for PT Re-Evaluation 08/08/19    Authorization Type [employer]    PT Start Time 0801    PT Stop Time 0850    PT Time Calculation (min) 49 min    Activity Tolerance Patient tolerated treatment well           History reviewed. No pertinent past medical history.  Past Surgical History:  Procedure Laterality Date   Bilateral Leg Lift  December 2012   Tummy tuck  December 18th 2012    There were no vitals filed for this visit.   Subjective Assessment - 07/25/19 0805    Subjective Some increaesd pain and popping in the Rt shoulder Saturday. Continued pain on Sunday but now resolving. Otherwise doing ok. Worked out with weights the past couple of days and did fine.    Currently in Pain? No/denies              [facility] PT Assessment - 07/25/19 0001      Assessment   Medical Diagnosis impingement syndrome rt shoulder    Referring Provider (PT) Dr. Dianah Field    Onset Date/Surgical Date 03/04/19    Hand Dominance Right    Next MD Visit 07/30/19      AROM   Right Shoulder Extension 65 Degrees    Right Shoulder Flexion 174 Degrees    Right Shoulder ABduction 168 Degrees    Right Shoulder Internal Rotation 58 Degrees    Right Shoulder External Rotation 94 Degrees      Strength   Right Shoulder Flexion 5/5    Right Shoulder Extension 5/5    Right Shoulder ABduction 5/5    Right Shoulder Internal Rotation 5/5    Right Shoulder External Rotation 5/5   pain      Palpation   Palpation comment muscular tightness through the Rt pecs; anterior shoulder; upper  trap; leveator; teres; lats                          [facility] Adult PT Treatment/Exercise - 07/25/19 0001      Shoulder Exercises: Standing   Extension Strengthening;Both;10 reps;Theraband    Theraband Level (Shoulder Extension) Level 4 (Blue)    Row Strengthening;Both;10 reps;Theraband    Theraband Level (Shoulder Row) Level 4 (Blue)    Retraction Strengthening;Both;15 reps;Theraband    Theraband Level (Shoulder Retraction) Level 1 (Yellow)    Other Standing Exercises scap squeeze 10 sec x 10; L's x 10; W'x x 10 with foam roll along spine       Shoulder Exercises: Stretch   Wall Stretch - ABduction 3 reps;30 seconds   T at wall    Other Shoulder Stretches biceps stretch 30 sec x 1 rep at counter     Other Shoulder Stretches 3 positions doorway stretch 30 sec x 2 reps each position       Moist Heat Therapy   Number Minutes Moist Heat 10 Minutes    Moist Heat Location Shoulder   thoracic      Manual Therapy   Manual therapy  comments skilled palpation and monitoring of soft tissues during DN    Soft tissue mobilization pt supine - deep tissue work through the pecs/anterior chest; periscapular musculature; teres; lats    Scapular Mobilization Rt     Passive ROM Rt shoulder flexion; ER; IR             Trigger Point Dry Needling - 07/25/19 0001    Consent Given? Yes    Education Handout Provided Previously provided    Dry Needling Comments right    Pectoralis Major Response Palpable increased muscle length;Twitch response elicited    Pectoralis Minor Response Palpable increased muscle length;Twitch response elicited    Deltoid Response Palpable increased muscle length    Biceps Response Palpable increased muscle length                PT Education - 07/25/19 0825    Education Details HEP    Person(s) Educated Patient    Methods Explanation;Demonstration;Tactile cues;Verbal cues;Handout    Comprehension Verbalized understanding;Returned demonstration;Verbal  cues required;Tactile cues required               PT Long Term Goals - 06/27/19 1015      PT LONG TERM GOAL #1   Title Ind with HEP for strength and flexilbilty to maintain good posture.    Time 6    Period Weeks    Status New    Target Date 08/08/19      PT LONG TERM GOAL #2   Title Patient to demo right shoulder IR strength of 5/5 to prevent further injury.    Time 6    Period Weeks    Status New      PT LONG TERM GOAL #3   Title Patient able to perform OH lifting at home and in the gym without pain in the right shoulder.    Time 6    Period Weeks    Status New      PT LONG TERM GOAL #4   Title Improved FOTO score limitations to 25% or less.    Baseline 33% limitations.    Time 6    Period Weeks    Status New      PT LONG TERM GOAL #5   Title Patient to demo full AROM of right shoulder to normalize ADLS.    Time 6    Period Weeks    Status New                 Plan - 07/25/19 0810    Clinical Impression Statement Continued improvement overall with flare up of symptoms Saturday and Sunday. Good response to DN and manual work. Improving functional abilities. Progressing gradually toward stated goals of therapy. Will benefit from a couple more visits to progress strengthening now that pain is decreased.    Rehab Potential Excellent    PT Frequency 2x / week    PT Duration 6 weeks    PT Treatment/Interventions ADLs/Self Care Home Management;Cryotherapy;Electrical Stimulation;Iontophoresis 4mg /ml Dexamethasone;Moist Heat;Ultrasound;Neuromuscular re-education;Therapeutic exercise;Therapeutic activities;Patient/family education;Manual techniques;Dry needling;Spinal Manipulations;Joint Manipulations    PT Next Visit Plan continue DN as indicated; pec stretch and mobility on foam roller; add horizontal ABD with band; serratus ant strengthening - strengthening ER    PT Home Exercise Plan [email or web address]    Consulted and Agree with Plan of Care Patient            Patient will benefit from skilled therapeutic intervention in order to improve the following deficits and impairments:  Visit Diagnosis: Acute pain of right shoulder     Problem List Patient Active Problem List   Diagnosis Date Noted   Impingement syndrome, shoulder, right 06/18/2019   Pain in right leg 02/12/2019   Basal cell carcinoma 10/03/2016   Osteopenia 08/31/2011   DEPRESSION, MILD 10/01/2009   FATIGUE 10/01/2009   THYROID NODULE 04/13/2009   GERD 10/08/2007    Lulia Schriner Nilda Simmer PT, MPH  07/25/2019, 8:44 AM  [facility] [place], [place], [address] Phone: [phone]   Fax:  [phone]  Name: ANNTIONETTE MADKINS MRN: [id number] Date of Birth: Jul 29, 1960

## 2019-07-28 ENCOUNTER — Encounter: Payer: Self-pay | Admitting: Rehabilitative and Restorative Service Providers"

## 2019-07-30 ENCOUNTER — Ambulatory Visit (INDEPENDENT_AMBULATORY_CARE_PROVIDER_SITE_OTHER): Payer: No Typology Code available for payment source | Admitting: Sports Medicine

## 2019-07-30 DIAGNOSIS — M7541 Impingement syndrome of right shoulder: Secondary | ICD-10-CM | POA: Diagnosis not present

## 2019-07-30 NOTE — Progress Notes (Signed)
    Procedures performed today:    None.  Independent interpretation of notes and tests performed by another provider:   None.  Brief History, Exam, Impression, and Recommendations:    Impingement syndrome, shoulder, right This is a very pleasant 59 year old female, we have been treating her for shoulder pain for some time now, historically she has had impingement type symptoms, as well as symptoms of an infraspinatus tear with weakness and pain to external rotation, we have done formal physical therapy, she had a bit of gastritis with meloxicam, I did a subacromial injection, she did really well until recently. Unfortunately she continues to have severe pain with resisted external rotation consistent with an infraspinatus tear, she is going to do 2 more weeks of therapy, however I do think she needs an MRI and surgical consultation.    ___________________________________________ Gwen Her. Dianah Field, M.D., ABFM., CAQSM. Primary Care and [place] Instructor of [place] [employer] [facility] [employer]

## 2019-07-30 NOTE — Assessment & Plan Note (Signed)
This is a very pleasant 59 year old female, we have been treating her for shoulder pain for some time now, historically she has had impingement type symptoms, as well as symptoms of an infraspinatus tear with weakness and pain to external rotation, we have done formal physical therapy, she had a bit of gastritis with meloxicam, I did a subacromial injection, she did really well until recently. Unfortunately she continues to have severe pain with resisted external rotation consistent with an infraspinatus tear, she is going to do 2 more weeks of therapy, however I do think she needs an MRI and surgical consultation.

## 2019-08-01 ENCOUNTER — Ambulatory Visit (INDEPENDENT_AMBULATORY_CARE_PROVIDER_SITE_OTHER): Payer: No Typology Code available for payment source | Admitting: Rehabilitative and Restorative Service Providers"

## 2019-08-01 ENCOUNTER — Encounter: Payer: Self-pay | Admitting: Rehabilitative and Restorative Service Providers"

## 2019-08-01 ENCOUNTER — Other Ambulatory Visit: Payer: Self-pay

## 2019-08-01 DIAGNOSIS — M25511 Pain in right shoulder: Secondary | ICD-10-CM | POA: Diagnosis not present

## 2019-08-01 DIAGNOSIS — M7541 Impingement syndrome of right shoulder: Secondary | ICD-10-CM

## 2019-08-01 NOTE — Therapy (Signed)
[place], [place], [address] Phone: [phone]   Fax:  [phone]  Physical Therapy Treatment  Patient Details  Name: Kaitlyn Knox MRN: [id number] Date of Birth: 06/09/1960 Referring Provider (PT): Dr. Dianah Field   Encounter Date: 08/01/2019   PT End of Session - 08/01/19 0805    Visit Number 5    Number of Visits 12    Date for PT Re-Evaluation 08/08/19    PT Start Time 0800    PT Stop Time 0849    PT Time Calculation (min) 49 min    Activity Tolerance Patient tolerated treatment well           History reviewed. No pertinent past medical history.  Past Surgical History:  Procedure Laterality Date   Bilateral Leg Lift  December 2012   Tummy tuck  December 18th 2012    There were no vitals filed for this visit.   Subjective Assessment - 08/01/19 0814    Subjective Increased pain last Friday - She stopped exercises and used some meloxacam and symptoms have improved some. She is going to proceed with MRI. She continues to work out at [employer].    Currently in Pain? Yes    Pain Score 4     Pain Location Shoulder    Pain Orientation Right    Pain Descriptors / Indicators Discomfort    Pain Type Acute pain    Pain Onset More than a month ago    Pain Frequency Intermittent              [facility] PT Assessment - 08/01/19 0001      Assessment   Medical Diagnosis impingement syndrome rt shoulder    Referring Provider (PT) Dr. Dianah Field    Onset Date/Surgical Date 03/04/19    Hand Dominance Right      AROM   Right Shoulder Extension 70 Degrees    Right Shoulder Flexion 172 Degrees    Right Shoulder ABduction 169 Degrees    Right Shoulder Internal Rotation 58 Degrees    Right Shoulder External Rotation 94 Degrees   painful mid range      Palpation   Palpation comment muscular tightness through the Rt pecs; anterior shoulder; upper trap; leveator; teres; lats                           [facility] Adult PT Treatment/Exercise - 08/01/19 0001      Shoulder Exercises: Isometric Strengthening   External Rotation --   5 sec hold x 10 UE in neutral position     Shoulder Exercises: Stretch   Other Shoulder Stretches 3 positions doorway stretch 30 sec x 2 reps each position       Moist Heat Therapy   Number Minutes Moist Heat 15 Minutes    Moist Heat Location Shoulder   thoracic      Electrical Stimulation   Electrical Stimulation Location Rt shoulder girdle     Electrical Stimulation Action TENS     Electrical Stimulation Parameters to tolerance    Electrical Stimulation Goals Pain;Tone      Iontophoresis   Type of Iontophoresis Dexamethasone    Location anterior shoulder    Dose 80 mAmp/1 cc    Time 8 hours                   PT Education - 08/01/19 0844    Education Details HEP TENS  IONTO    Person(s) Educated Patient    Methods Explanation;Demonstration;Tactile cues;Verbal cues;Handout    Comprehension Verbalized understanding;Returned demonstration;Verbal cues required;Tactile cues required               PT Long Term Goals - 06/27/19 1015      PT LONG TERM GOAL #1   Title Ind with HEP for strength and flexilbilty to maintain good posture.    Time 6    Period Weeks    Status New    Target Date 08/08/19      PT LONG TERM GOAL #2   Title Patient to demo right shoulder IR strength of 5/5 to prevent further injury.    Time 6    Period Weeks    Status New      PT LONG TERM GOAL #3   Title Patient able to perform OH lifting at home and in the gym without pain in the right shoulder.    Time 6    Period Weeks    Status New      PT LONG TERM GOAL #4   Title Improved FOTO score limitations to 25% or less.    Baseline 33% limitations.    Time 6    Period Weeks    Status New      PT LONG TERM GOAL #5   Title Patient to demo full AROM of right shoulder to normalize ADLS.    Time 6    Period Weeks    Status New                  Plan - 08/01/19 6629    Clinical Impression Statement Flare up of symptoms following last visit. Pain is improved today but patient has some continued discomort in the Rt shoulder. ROM remains good with pain with ER in abduction. She has palpable tightness through the infraspinitus. Responded well to DN and manual work in previous visits - held today. Added isometric ER without pain. Trial of TENS unit which she has used in the past with good success for LBP. Reassess at next visit.    Rehab Potential Excellent    PT Frequency 2x / week    PT Duration 6 weeks    PT Treatment/Interventions ADLs/Self Care Home Management;Cryotherapy;Electrical Stimulation;Iontophoresis 4mg /ml Dexamethasone;Moist Heat;Ultrasound;Neuromuscular re-education;Therapeutic exercise;Therapeutic activities;Patient/family education;Manual techniques;Dry needling;Spinal Manipulations;Joint Manipulations    PT Next Visit Plan continue DN as indicated;  strengthening; assess response to TENS and ionto    PT Home Exercise Plan [email or web address]    Consulted and Agree with Plan of Care Patient           Patient will benefit from skilled therapeutic intervention in order to improve the following deficits and impairments:     Visit Diagnosis: Acute pain of right shoulder     Problem List Patient Active Problem List   Diagnosis Date Noted   Impingement syndrome, shoulder, right 06/18/2019   Pain in right leg 02/12/2019   Basal cell carcinoma 10/03/2016   Osteopenia 08/31/2011   DEPRESSION, MILD 10/01/2009   FATIGUE 10/01/2009   THYROID NODULE 04/13/2009   GERD 10/08/2007    Kaitlyn Knox PT, MPH  08/01/2019, 12:22 PM  [facility] [place], [place], [address] Phone: [phone]   Fax:  [phone]  Name: Kaitlyn Knox MRN: [id number] Date of Birth: 07/07/1960

## 2019-08-01 NOTE — Patient Instructions (Signed)
Access Code: [email or web address]: [email or web address]: 07/29/2021Prepared by: Azreal Stthomas HoltExercises  Doorway Pec Stretch at 90 Degrees Abduction - 1 x daily - 7 x weekly - 3 reps - 1 sets - 30-60 seconds hold  Sleeper Stretch - 2 x daily - 7 x weekly - 1 sets - 3 reps - 30-60 sec hold  Seated Scapular Retraction - 1 x daily - 7 x weekly - 10 reps - 1-33 sets - 2-3 sec hold  Standing Shoulder Internal Rotation with Anchored Resistance - 1 x daily - 7 x weekly - 3 sets - 10 reps  Seated Scapular Retraction - 2 x daily - 7 x weekly - 1 sets - 10 reps - 10 sec hold  Shoulder External Rotation and Scapular Retraction - 2 x daily - 7 x weekly - 1 sets - 10 reps - 2-3 sec hold  Shoulder External Rotation in 45 Degrees Abduction - 2 x daily - 7 x weekly - 1 sets - 10 reps - 2-3 sec hold  Doorway Pec Stretch at 60 Degrees Abduction - 3 x daily - 7 x weekly - 3 reps - 1 sets  Doorway Pec Stretch at 90 Degrees Abduction - 3 x daily - 7 x weekly - 3 reps - 1 sets - 30 seconds hold  Doorway Pec Stretch at 120 Degrees Abduction - 3 x daily - 7 x weekly - 3 reps - 1 sets - 30 second hold hold  Hooklying Shoulder T - 2 x daily - 7 x weekly - 1 sets - 1 reps - 2-5 min hold  Standing Shoulder External Rotation with Resistance - 2 x daily - 7 x weekly - 1-3 sets - 10 reps - 2-3 sec hold  Standing Bilateral Low Shoulder Row with Anchored Resistance - 2 x daily - 7 x weekly - 1-3 sets - 2-3 reps - 10 sec hold  Shoulder Extension with Resistance - 2 x daily - 7 x weekly - 1-3 sets - 10 reps - 2-3 sec hold  Shoulder ER Stretch in Abduction - 2 x daily - 7 x weekly - 1 sets - 3 reps - 30 sec hold  Standing Isometric Shoulder External Rotation with Doorway - 2 x daily - 7 x weekly - 1 sets - 8 reps - 5 sec hold Patient Education  [place] Patient Instructions  TENS Unit

## 2019-08-08 ENCOUNTER — Other Ambulatory Visit: Payer: Self-pay

## 2019-08-08 ENCOUNTER — Ambulatory Visit (INDEPENDENT_AMBULATORY_CARE_PROVIDER_SITE_OTHER): Payer: No Typology Code available for payment source | Admitting: Physical Therapy

## 2019-08-08 ENCOUNTER — Encounter: Payer: Self-pay | Admitting: Physical Therapy

## 2019-08-08 DIAGNOSIS — M25511 Pain in right shoulder: Secondary | ICD-10-CM | POA: Diagnosis not present

## 2019-08-08 NOTE — Therapy (Addendum)
[place], [place], [address] Phone: [phone]   Fax:  [phone]  Physical Therapy Treatment  Patient Details  Name: MALLOREE RABOIN MRN: [id number] Date of Birth: Oct 17, 1960 Referring Provider (PT): Dr. Dianah Field   Encounter Date: 08/08/2019   PT End of Session - 08/08/19 0801    Visit Number 6    Number of Visits 12    Date for PT Re-Evaluation 08/08/19    Authorization Type [employer]    PT Start Time 0800    PT Stop Time 0849    PT Time Calculation (min) 49 min    Activity Tolerance Patient tolerated treatment well    Behavior During Therapy [facility] for tasks assessed/performed           History reviewed. No pertinent past medical history.  Past Surgical History:  Procedure Laterality Date  . Bilateral Leg Lift  December 2012  . Tummy tuck  December 18th 2012    There were no vitals filed for this visit.   Subjective Assessment - 08/08/19 0802    Subjective A little better than last visit. Still getting pain with certain movements.    Pertinent History broke rt shoulder when she was 9    Diagnostic tests xray - some narrowing of A/C joint    Patient Stated Goals be able to have full range without pain              [facility] PT Assessment - 08/08/19 0001      Strength   Overall Strength Comments rt mid trap 4/5                         [facility] Adult PT Treatment/Exercise - 08/08/19 0001      Shoulder Exercises: Prone   Flexion Right;10 reps    Flexion Weight (lbs) 2    Extension Right;10 reps    Extension Weight (lbs) 2    Extension Limitations also triceps ext x 10    Horizontal ABduction 1 Right;10 reps    Horizontal ABduction 1 Weight (lbs) 1    Horizontal ABduction 2 Right;10 reps    Horizontal ABduction 2 Weight (lbs) 1    Horizontal ABduction 2 Limitations popping      Shoulder Exercises: Standing   Flexion Right;10 reps    Shoulder Flexion Weight  (lbs) 2    Flexion Limitations pain after 6 reps; feels weaker per pt    ABduction Right;10 reps    Shoulder ABduction Weight (lbs) 1    Other Standing Exercises scap squeeze 10 sec x 10; L's x 10; W'x x 10 with foam roll along spine     Other Standing Exercises scaption 1 # x 10; wall push up x 10      Iontophoresis   Type of Iontophoresis Dexamethasone    Location anterior shoulder    Dose 80 mAmp/1 cc    Time 8 hours       Manual Therapy   Manual therapy comments skilled palpation and monitoring of soft tissues during DN    Soft tissue mobilization to left triceps, infraspinatus and lats            Trigger Point Dry Needling - 08/08/19 0001    Consent Given? Yes    Education Handout Provided Previously provided    Muscles Treated Upper Quadrant Infraspinatus;Latissimus dorsi;Triceps    Dry Needling Comments right    Infraspinatus Response Twitch response elicited;Palpable  increased muscle length    Latissimus dorsi Response Palpable increased muscle length    Triceps Response Twitch response elicited;Palpable increased muscle length                     PT Long Term Goals - 08/08/19 1005      PT LONG TERM GOAL #1   Title Ind with HEP for strength and flexilbilty to maintain good posture.    Status Partially Met      PT LONG TERM GOAL #3   Title Patient able to perform OH lifting at home and in the gym without pain in the right shoulder.    Status On-going      PT LONG TERM GOAL #5   Title Patient to demo full AROM of right shoulder to normalize ADLS.    Status On-going                 Plan - 08/08/19 1001    Clinical Impression Statement Patient has returned to previous level of discomfort. She felt the ionto patch helped so patch #2 administered at end of treatment. She has weakness in the right triceps with wall pushups and reports that she is unable to do standing triceps extension at the gym due to pain. She has tightness and TPs in the right  triceps which responded well to DN and manual therapy today. Infraspinatus still tender but improved since last visit. Mid traps still 4/5.    PT Frequency 2x / week    PT Duration 6 weeks    PT Treatment/Interventions ADLs/Self Care Home Management;Cryotherapy;Electrical Stimulation;Iontophoresis 28m/ml Dexamethasone;Moist Heat;Ultrasound;Neuromuscular re-education;Therapeutic exercise;Therapeutic activities;Patient/family education;Manual techniques;Dry needling;Spinal Manipulations;Joint Manipulations    PT Next Visit Plan continue DN as indicated;  strengthening; assess response to ionto #2           Patient will benefit from skilled therapeutic intervention in order to improve the following deficits and impairments:  Increased muscle spasms, Pain, Decreased range of motion, Impaired flexibility, Decreased strength  Visit Diagnosis: Acute pain of right shoulder     Problem List Patient Active Problem List   Diagnosis Date Noted  . Impingement syndrome, shoulder, right 06/18/2019  . Pain in right leg 02/12/2019  . Basal cell carcinoma 10/03/2016  . Osteopenia 08/31/2011  . DEPRESSION, MILD 10/01/2009  . FATIGUE 10/01/2009  . THYROID NODULE 04/13/2009  . GERD 10/08/2007    JMadelyn FlavorsPT 08/08/2019, 10:07 AM  [facility] [address]Phone: [phone]  Fax:  [phone] Name: SLARAMIE GELLESMRN: [id number] of Birth: 509-Jul-1962 PHYSICAL THERAPY DISCHARGE SUMMARY  Visits from Start of Care: 6  Current functional level related to goals / functional outcomes: See last progress note for discharge status.    Remaining deficits: MRI shows full thickness tear of supraspinitus and partial thickness tear of infraspinitus    Education / Equipment: HEP   Plan: Patient agrees to discharge.  Patient goals were not met. Patient is being discharged due to a change in medical status.   ?????    Celyn P. HHelene KelpPT, MPH 08/19/19 10:52 AM

## 2019-08-13 ENCOUNTER — Encounter: Payer: Self-pay | Admitting: Sports Medicine

## 2019-08-13 DIAGNOSIS — M75121 Complete rotator cuff tear or rupture of right shoulder, not specified as traumatic: Secondary | ICD-10-CM

## 2019-08-13 DIAGNOSIS — M7541 Impingement syndrome of right shoulder: Secondary | ICD-10-CM

## 2019-08-13 NOTE — Telephone Encounter (Signed)
This encounter was created in error - please disregard.

## 2019-08-16 NOTE — Telephone Encounter (Signed)
Redone Referral as pt requested and sent to Dr.Brumfield's office

## 2020-02-10 LAB — HM MAMMOGRAPHY

## 2020-06-17 ENCOUNTER — Encounter: Payer: Self-pay | Admitting: Family Medicine

## 2020-10-05 ENCOUNTER — Telehealth: Payer: Self-pay | Admitting: Family Medicine

## 2020-10-05 DIAGNOSIS — Z0000 Encounter for general adult medical examination without abnormal findings: Secondary | ICD-10-CM

## 2020-10-05 NOTE — Telephone Encounter (Signed)
Patient would like labs ordered for her physical.

## 2020-10-06 NOTE — Telephone Encounter (Signed)
Orders Placed This Encounter  Procedures   Lipid Panel w/reflex Direct LDL   COMPLETE METABOLIC PANEL WITH GFR   CBC

## 2020-10-06 NOTE — Telephone Encounter (Signed)
Labs ordered 10/06/2020.

## 2020-10-24 LAB — CBC
HCT: 43.7 % (ref 35.0–45.0)
Hemoglobin: 14.8 g/dL (ref 11.7–15.5)
MCH: 31.1 pg (ref 27.0–33.0)
MCHC: 33.9 g/dL (ref 32.0–36.0)
MCV: 91.8 fL (ref 80.0–100.0)
MPV: 10.5 fL (ref 7.5–12.5)
Platelets: 243 10*3/uL (ref 140–400)
RBC: 4.76 10*6/uL (ref 3.80–5.10)
RDW: 12.1 % (ref 11.0–15.0)
WBC: 4.1 10*3/uL (ref 3.8–10.8)

## 2020-10-24 LAB — COMPLETE METABOLIC PANEL WITH GFR
AG Ratio: 1.8 (calc) (ref 1.0–2.5)
ALT: 17 U/L (ref 6–29)
AST: 22 U/L (ref 10–35)
Albumin: 4.1 g/dL (ref 3.6–5.1)
Alkaline phosphatase (APISO): 56 U/L (ref 37–153)
BUN: 11 mg/dL (ref 7–25)
CO2: 27 mmol/L (ref 20–32)
Calcium: 9.1 mg/dL (ref 8.6–10.4)
Chloride: 106 mmol/L (ref 98–110)
Creat: 0.85 mg/dL (ref 0.50–1.05)
Globulin: 2.3 g/dL (calc) (ref 1.9–3.7)
Glucose, Bld: 87 mg/dL (ref 65–99)
Potassium: 4.2 mmol/L (ref 3.5–5.3)
Sodium: 141 mmol/L (ref 135–146)
Total Bilirubin: 0.5 mg/dL (ref 0.2–1.2)
Total Protein: 6.4 g/dL (ref 6.1–8.1)
eGFR: 78 mL/min/{1.73_m2} (ref >=60)

## 2020-10-24 LAB — LIPID PANEL W/REFLEX DIRECT LDL
Cholesterol: 251 mg/dL — ABNORMAL HIGH (ref <200)
HDL: 75 mg/dL (ref >=50)
LDL Cholesterol (Calc): 148 mg/dL (calc) — ABNORMAL HIGH
Non-HDL Cholesterol (Calc): 176 mg/dL (calc) — ABNORMAL HIGH (ref <130)
Total CHOL/HDL Ratio: 3.3 (calc) (ref <5.0)
Triglycerides: 149 mg/dL (ref <150)

## 2020-10-27 NOTE — Progress Notes (Signed)
I want to discuss your cholesterol when I see you at your physical.  It has been trending upward.

## 2020-11-10 ENCOUNTER — Encounter: Payer: Self-pay | Admitting: Family Medicine

## 2020-11-10 ENCOUNTER — Other Ambulatory Visit: Payer: Self-pay

## 2020-11-10 ENCOUNTER — Ambulatory Visit (INDEPENDENT_AMBULATORY_CARE_PROVIDER_SITE_OTHER): Payer: No Typology Code available for payment source | Admitting: Family Medicine

## 2020-11-10 VITALS — BP 114/75 | HR 63 | Temp 97.8°F | Wt 141.1 lb

## 2020-11-10 DIAGNOSIS — Z23 Encounter for immunization: Secondary | ICD-10-CM

## 2020-11-10 DIAGNOSIS — Z0000 Encounter for general adult medical examination without abnormal findings: Secondary | ICD-10-CM | POA: Diagnosis not present

## 2020-11-10 NOTE — Patient Instructions (Signed)
Plan to recheck cholesterol in  6 months.

## 2020-11-10 NOTE — Progress Notes (Signed)
Subjective:     Kaitlyn Knox is a 60 y.o. female and is here for a comprehensive physical exam. The patient reports problems - stress .  Does have some significant increase stress levels and helping take care of her mother who has been diagnosed with dementia and her uncle who does not have any children and who is currently under hospice care.  She is also working full-time and very active in her church.  She reports she has not had as much time to hike and exercise as she was previously but in this past week did start walking in the mornings.  Mammogram performed February 2022.  We will get that abstracted into the chart. [facility] for Pap smear last performed September 2020. Labs done in October.   Social History   Socioeconomic History   Marital status: Married    Spouse name: Not on file   Number of children: Not on file   Years of education: Not on file   Highest education level: Not on file  Occupational History   Not on file  Tobacco Use   Smoking status: Former    Packs/day: 1.00    Types: Cigarettes    Quit date: 08/17/2012    Years since quitting: 8.2   Smokeless tobacco: Never  Substance and Sexual Activity   Alcohol use: Not on file   Drug use: Not on file   Sexual activity: Not on file  Other Topics Concern   Not on file  Social History Narrative   Not on file   Social Determinants of Health   Financial Resource Strain: Not on file  Food Insecurity: Not on file  Transportation Needs: Not on file  Physical Activity: Not on file  Stress: Not on file  Social Connections: Not on file  Intimate Partner Violence: Not on file   Health Maintenance  Topic Date Due   Pneumococcal Vaccine 44-70 Years old (1 - PCV) Never done   COVID-19 Vaccine (5 - Booster for [place] series) 08/22/2019   MAMMOGRAM  10/31/2020   PAP SMEAR-Modifier  03/25/2021   TETANUS/TDAP  04/20/2022   COLONOSCOPY (Pts 45-64yrs Insurance coverage will need to be confirmed)  09/24/2023    INFLUENZA VACCINE  Completed   Hepatitis C Screening  Completed   HIV Screening  Completed   Zoster Vaccines- Shingrix  Completed   HPV VACCINES  Aged Out    The following portions of the patient's history were reviewed and updated as appropriate: allergies, current medications, past family history, past medical history, past social history, past surgical history, and problem list.  Review of Systems Pertinent items are noted in HPI.   Objective:    BP 114/75 (BP Location: Left Arm, Patient Position: Sitting, Cuff Size: Normal)   Pulse 63   Temp 97.8 F (36.6 C) (Oral)   Wt 141 lb 1.9 oz (64 kg)   SpO2 100%   BMI 25.00 kg/m  General appearance: alert, cooperative, and appears stated age Head: Normocephalic, without obvious abnormality, atraumatic Eyes:  conj clear, EOMI, PEERLA Ears: normal TM's and external ear canals both ears Nose: Nares normal. Septum midline. Mucosa normal. No drainage or sinus tenderness. Throat: lips, mucosa, and tongue normal; teeth and gums normal Neck: no adenopathy, no carotid bruit, no JVD, supple, symmetrical, trachea midline, and thyroid not enlarged, symmetric, no tenderness/mass/nodules Back: symmetric, no curvature. ROM normal. No CVA tenderness. Lungs: clear to auscultation bilaterally Heart: regular rate and rhythm, S1, S2 normal, no murmur, click, rub  or gallop Abdomen: soft, non-tender; bowel sounds normal; no masses,  no organomegaly Extremities: extremities normal, atraumatic, no cyanosis or edema Pulses: 2+ and symmetric Skin: Skin color, texture, turgor normal. No rashes or lesions Lymph nodes: Cervical adenopathy: nl and Supraclavicular adenopathy: nl Neurologic: Grossly normal    Assessment:    Healthy female exam.     Plan:     See After Visit Summary for Counseling Recommendations  Keep up a regular exercise program and make sure you are eating a healthy diet Try to eat 4 servings of dairy a day, or if you are lactose  intolerant take a calcium with vitamin D daily.  Your vaccines are up to date.  Flu vacc given today.  We did go over the lab results today and discussed the elevated lipid levels.  She is can plan on getting back on track with regular exercise and we will plan to recheck the lipids in about 6 months.

## 2020-11-13 ENCOUNTER — Encounter: Payer: Self-pay | Admitting: Family Medicine

## 2021-01-11 ENCOUNTER — Encounter: Payer: Self-pay | Admitting: Family Medicine

## 2021-01-12 NOTE — Telephone Encounter (Signed)
Please see MyChart note sent to patient.   @TODAY @

## 2021-01-18 MED ORDER — SCOPOLAMINE 1 MG/3DAYS TD PT72: 1.0000 | MEDICATED_PATCH | TRANSDERMAL | 0 refills | Status: DC

## 2021-01-18 NOTE — Addendum Note (Signed)
Addended by: Beatrice Lecher D on: 01/18/2021 05:10 PM   Modules accepted: Orders

## 2021-01-18 NOTE — Telephone Encounter (Signed)
°  Meds ordered this encounter  Medications   scopolamine (TRANSDERM-SCOP) 1 MG/3DAYS    Sig: Place 1 patch (1.5 mg total) onto the skin every 3 (three) days.    Dispense:  4 patch    Refill:  0   We may need to check Rick's chart to verify if he got his prescriptions.

## 2021-01-21 IMAGING — DX DG SHOULDER 2+V*R*
3 series · 3 of 3 positions shown · non-contrast
Comparison: None.

CLINICAL DATA: Pain

EXAM:
RIGHT SHOULDER - 2+ VIEW

[shoulder grashey]
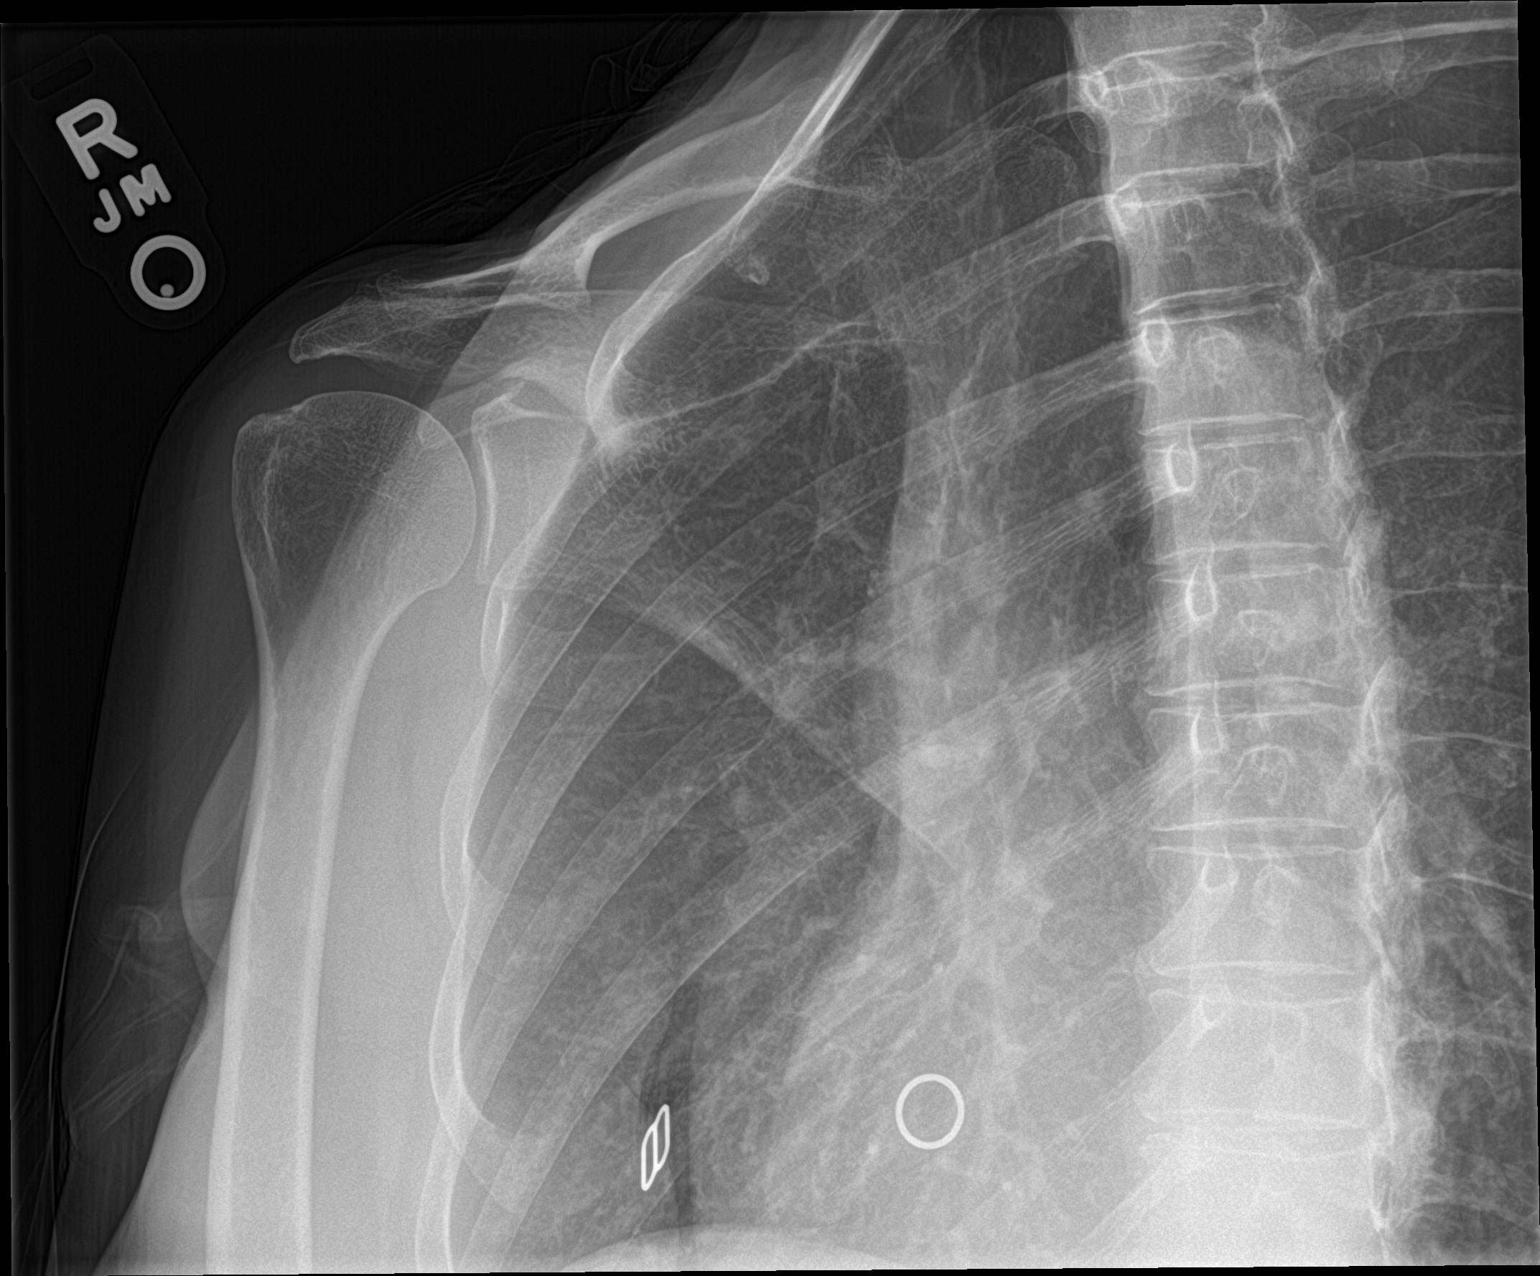

[shoulder y view]
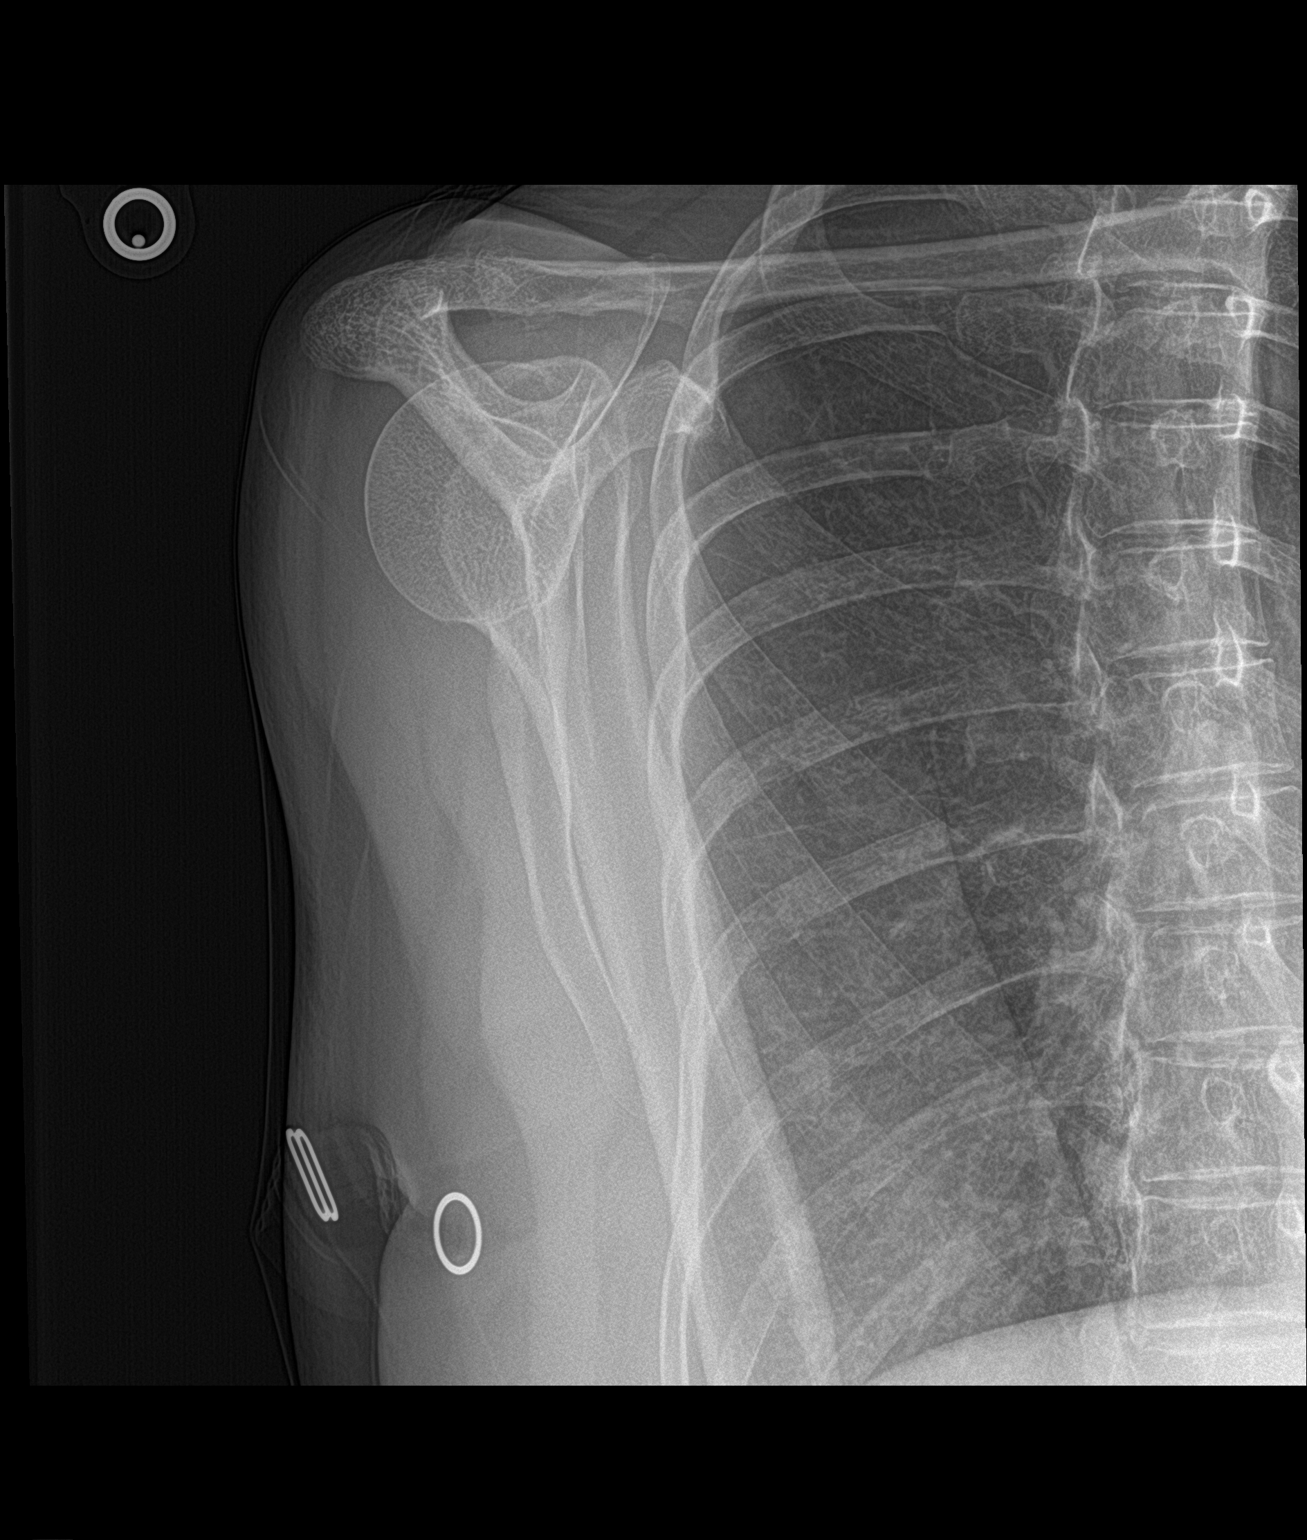

[shoulder axillary]
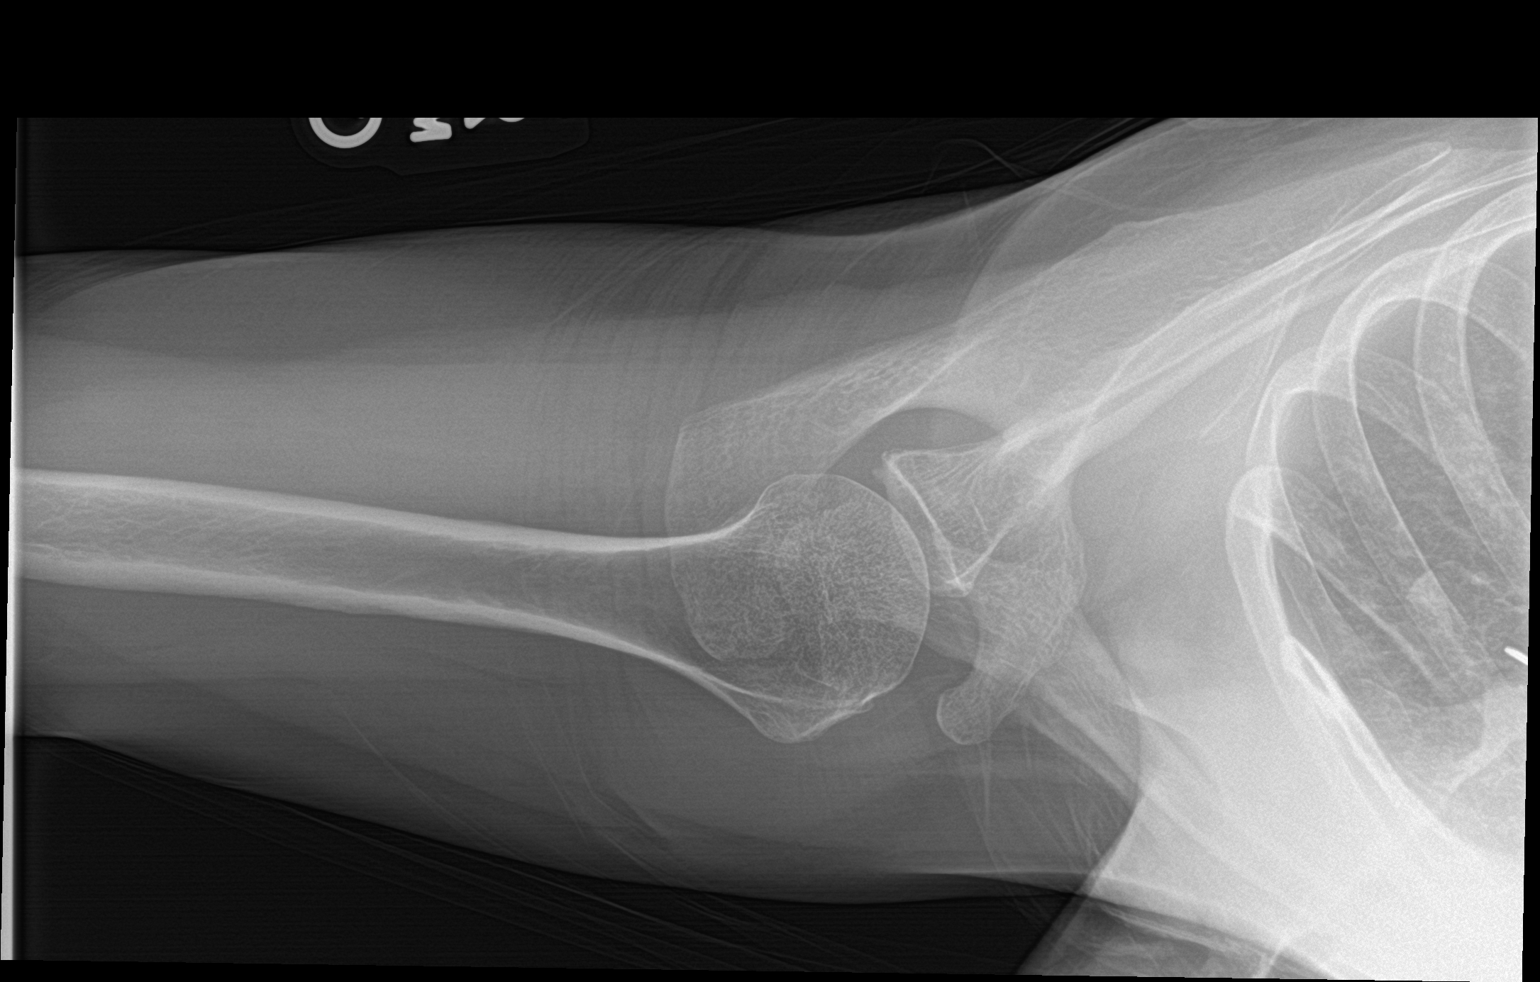

[3 of 3 positions shown; findings below may reference images not displayed]

FINDINGS: Oblique, Y scapular, and axillary images were obtained. No fracture
or dislocation. There is narrowing of the acromioclavicular joint.
Glenohumeral joint appears normal. No erosive change or
intra-articular calcification. Visualized right lung clear.
IMPRESSION: Narrowing acromioclavicular joint. Glenohumeral joint appears
normal. No fracture or dislocation. No erosive change.

## 2021-02-22 LAB — HM MAMMOGRAPHY

## 2021-05-13 ENCOUNTER — Encounter: Payer: Self-pay | Admitting: Family Medicine

## 2021-05-13 ENCOUNTER — Telehealth: Payer: Self-pay | Admitting: Family Medicine

## 2021-05-13 NOTE — Telephone Encounter (Signed)
Pls call [facility] for last pap smear.  ?

## 2021-05-14 NOTE — Telephone Encounter (Signed)
Medical records release faxed to [facility] for last pap. ?

## 2021-06-22 LAB — HEPATIC FUNCTION PANEL
ALT: 18 U/L (ref 7–35)
AST: 21 (ref 13–35)
Alkaline Phosphatase: 76 (ref 25–125)
Bilirubin, Total: 0.3

## 2021-06-22 LAB — COMPREHENSIVE METABOLIC PANEL
Albumin: 4.3 (ref 3.5–5.0)
Calcium: 8.1 — AB (ref 8.7–10.7)
Calcium: 8.1 — AB (ref 8.7–10.7)
eGFR: 91
eGFR: 91

## 2021-06-22 LAB — BASIC METABOLIC PANEL
BUN: 14 (ref 4–21)
BUN: 14 (ref 4–21)
CO2: 22 (ref 13–22)
Chloride: 104 (ref 99–108)
Creatinine: 0.8 (ref 0.5–1.1)
Creatinine: 0.8 (ref 0.5–1.1)
Glucose: 83
Potassium: 4.6 mEq/L (ref 3.5–5.1)
Sodium: 141 (ref 137–147)

## 2021-06-22 LAB — LIPID PANEL
Cholesterol: 228 — AB (ref 0–200)
HDL: 68 (ref 35–70)
LDl/HDL Ratio: 2.1
Triglycerides: 100 (ref 40–160)

## 2021-06-22 LAB — HEMOGLOBIN A1C: Hemoglobin A1C: 5.5

## 2021-06-22 LAB — IRON,TIBC AND FERRITIN PANEL: Iron: 79

## 2021-07-21 ENCOUNTER — Encounter: Payer: Self-pay | Admitting: Family Medicine

## 2021-07-22 ENCOUNTER — Encounter: Payer: Self-pay | Admitting: Family Medicine

## 2021-09-03 ENCOUNTER — Encounter: Payer: Self-pay | Admitting: Family Medicine

## 2021-10-15 ENCOUNTER — Encounter: Payer: Self-pay | Admitting: Family Medicine

## 2021-10-15 NOTE — Telephone Encounter (Signed)
Hi Jen, can you squeeze her in an acute sometime next week.

## 2021-10-28 ENCOUNTER — Ambulatory Visit: Payer: No Typology Code available for payment source | Admitting: Family Medicine

## 2021-10-28 ENCOUNTER — Encounter: Payer: Self-pay | Admitting: Family Medicine

## 2021-10-28 VITALS — BP 107/61 | HR 75 | Ht 63.0 in | Wt 140.0 lb

## 2021-10-28 DIAGNOSIS — Z23 Encounter for immunization: Secondary | ICD-10-CM

## 2021-10-28 DIAGNOSIS — F419 Anxiety disorder, unspecified: Secondary | ICD-10-CM

## 2021-10-28 MED ORDER — SERTRALINE HCL 25 MG PO TABS: 25.0000 mg | ORAL_TABLET | Freq: Every day | ORAL | 0 refills | Status: DC

## 2021-10-28 MED ORDER — CLONAZEPAM 0.5 MG PO TABS: 0.5000 mg | ORAL_TABLET | Freq: Every day | ORAL | 0 refills | Status: DC | PRN

## 2021-10-28 NOTE — Progress Notes (Addendum)
Acute Office Visit  Subjective:     Patient ID: Kaitlyn Knox, female    DOB: June 06, 1960, 61 y.o.   MRN: [id number]  Chief Complaint  Patient presents with   mood    Pt reports that th    HPI Patient is in today for anxiety.  She says in the last several weeks she is just felt more anxious she is never really had significant difficulty with this in the past.  She is always seem to manage a lot of things and balance a lot of things in her life and is usually just able to cope and then move on but lately it just feels like she is worrying excessively about things that she really cannot control.  She is also had a lot going on in the past year.  Her father passed away, her mother was diagnosed with cancer and has advancing dementia.  She worries about her son who is a type I diabetic.  Her uncle who she was also the primary caretaker for also passed away this past year and now she has been dealing with his estate.  She just feels irritable at times at work.  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  Is fair.  She can usually fall asleep but then wakes up.  She does work out regularly and typically goes to the gym at least 3 days a week.  She has never taken medication for mood.  She did take Wellbutrin at 1 time for smoking cessation.  ROS      Objective:    BP 107/61   Pulse 75   Ht '5\' 3"'$  (1.6 m)   Wt 140 lb (63.5 kg)   SpO2 95%   BMI 24.80 kg/m    Physical Exam Vitals reviewed.  Constitutional:      Appearance: She is well-developed.  HENT:     Head: Normocephalic and atraumatic.  Eyes:     Conjunctiva/sclera: Conjunctivae normal.  Cardiovascular:     Rate and Rhythm: Normal rate.  Pulmonary:     Effort: Pulmonary effort is normal.  Skin:    General: Skin is dry.     Coloration: Skin is not pale.  Neurological:     Mental Status: She is alert and oriented to person, place, and time.   Psychiatric:        Behavior: Behavior normal.     No results found for any visits on 10/28/21.      Assessment & Plan:   Problem List Items Addressed This Visit       Other   Anxiety - Primary    Discussed  3 things to work on to get her anxiety better 1 is regular exercise which she already does.  Also discussed referral for therapy/counseling she is done some brief sessions through [employer] at work in the past.  Also discussed medication as an option which she is very open to.  We will start sertraline 25 mg daily.  Follow-up in 3 weeks to see how she is doing and make adjustments as needed.  Give her small quantity of clonazepam to use sparingly did warn about potential for side effects.  . [facility] Office Visit from 10/28/2021 in [facility]  PHQ-9 Total Score 5         10/28/2021    2:51 PM  GAD 7 :  Generalized Anxiety Score  Nervous, Anxious, on Edge 3  Control/stop worrying 3  Worry too much - different things 3  Trouble relaxing 2  Restless 0  Easily annoyed or irritable 3  Afraid - awful might happen 1  Total GAD 7 Score 15  Anxiety Difficulty Very difficult          Relevant Medications   sertraline (ZOLOFT) 25 MG tablet   clonazePAM (KLONOPIN) 0.5 MG tablet   Other Relevant Orders   Ambulatory referral to [facility]   Other Visit Diagnoses     Need for immunization against influenza       Relevant Orders   Flu Vaccine QUAD 79moIM (Fluarix, Fluzone & Alfiuria Quad PF) (Completed)       Meds ordered this encounter  Medications   sertraline (ZOLOFT) 25 MG tablet    Sig: Take 1 tablet (25 mg total) by mouth daily.    Dispense:  30 tablet    Refill:  0   clonazePAM (KLONOPIN) 0.5 MG tablet    Sig: Take 1 tablet (0.5 mg total) by mouth daily as needed for anxiety.    Dispense:  20 tablet    Refill:  0    Return in about 3 weeks (around 11/18/2021) for virtial follow up for new start medication  .  CBeatrice Lecher MD

## 2021-10-28 NOTE — Assessment & Plan Note (Addendum)
Discussed  3 things to work on to get her anxiety better 1 is regular exercise which she already does.  Also discussed referral for therapy/counseling she is done some brief sessions through [employer] at work in the past.  Also discussed medication as an option which she is very open to.  We will start sertraline 25 mg daily.  Follow-up in 3 weeks to see how she is doing and make adjustments as needed.  Give her small quantity of clonazepam to use sparingly did warn about potential for side effects.  . [facility] Office Visit from 10/28/2021 in [facility]  PHQ-9 Total Score 5        10/28/2021    2:51 PM  GAD 7 : Generalized Anxiety Score  Nervous, Anxious, on Edge 3  Control/stop worrying 3  Worry too much - different things 3  Trouble relaxing 2  Restless 0  Easily annoyed or irritable 3  Afraid - awful might happen 1  Total GAD 7 Score 15  Anxiety Difficulty Very difficult

## 2021-10-28 NOTE — Addendum Note (Signed)
Addended by: Beatrice Lecher D on: 10/28/2021 05:12 PM   Modules accepted: Orders

## 2021-11-19 ENCOUNTER — Other Ambulatory Visit: Payer: Self-pay | Admitting: Family Medicine

## 2021-11-26 ENCOUNTER — Encounter: Payer: Self-pay | Admitting: Family Medicine

## 2021-11-26 ENCOUNTER — Other Ambulatory Visit: Payer: Self-pay | Admitting: Family Medicine

## 2021-11-29 MED ORDER — ALPRAZOLAM 0.5 MG PO TABS: 0.5000 mg | ORAL_TABLET | Freq: Every day | ORAL | 0 refills | Status: DC | PRN

## 2021-11-29 MED ORDER — SERTRALINE HCL 25 MG PO TABS: 25.0000 mg | ORAL_TABLET | Freq: Every day | ORAL | 1 refills | Status: DC

## 2021-11-29 NOTE — Telephone Encounter (Signed)
Meds ordered this encounter  Medications   sertraline (ZOLOFT) 25 MG tablet    Sig: Take 1 tablet (25 mg total) by mouth daily.    Dispense:  90 tablet    Refill:  1   ALPRAZolam (XANAX) 0.5 MG tablet    Sig: Take 1 tablet (0.5 mg total) by mouth daily as needed for anxiety.    Dispense:  20 tablet    Refill:  0

## 2022-01-10 ENCOUNTER — Other Ambulatory Visit: Payer: Self-pay | Admitting: Family Medicine

## 2022-01-24 ENCOUNTER — Ambulatory Visit (INDEPENDENT_AMBULATORY_CARE_PROVIDER_SITE_OTHER): Payer: No Typology Code available for payment source | Admitting: Family Medicine

## 2022-01-24 ENCOUNTER — Encounter: Payer: Self-pay | Admitting: Family Medicine

## 2022-01-24 VITALS — BP 129/73 | HR 64 | Ht 63.0 in | Wt 138.0 lb

## 2022-01-24 DIAGNOSIS — Z0000 Encounter for general adult medical examination without abnormal findings: Secondary | ICD-10-CM

## 2022-01-24 NOTE — Progress Notes (Signed)
Complete physical exam  Patient: Kaitlyn Knox   DOB: 1960-01-05   62 y.o. Female  MRN: [id number]  Subjective:    Chief Complaint  Patient presents with   Annual Exam    Kaitlyn Knox is a 62 y.o. female who presents today for a complete physical exam. She reports consuming a general diet. The patient does not participate in regular exercise at present. She generally feels well. She reports sleeping poorly. She does have additional problems to discuss today.  She is actually planning on retiring at the end of the month.  She is retiring a couple months earlier than what she had originally planned with there is a lot of changes going on at work and it has been incredibly stressful.  Has not been working out consistently since having her foot surgery but plans to get back on track when she retires in a couple of weeks.  She is planning on going back to the fitness center.  Waking up at 3 AM and having difficulty going back to sleep.  She does think that once she [place]ater is able to make some changes then get back to the gym more regularly she feels that that will help.  Had her Pap smear completed last week.  Mammogram is been scheduled.   Most recent fall risk assessment:    01/24/2022    7:54 AM  Fall Risk   Falls in the past year? 0  Number falls in past yr: 0  Injury with Fall? 0  Risk for fall due to : No Fall Risks  Follow up Falls evaluation completed     Most recent depression screenings:    01/24/2022    8:24 AM 10/28/2021    2:50 PM  PHQ 2/9 Scores  PHQ - 2 Score 2 2  PHQ- 9 Score 9 5         Patient Care Team: Hali Marry, MD as PCP - General   Outpatient Medications Prior to Visit  Medication Sig   ALPRAZolam (XANAX) 0.5 MG tablet TAKE 1 TABLET BY MOUTH EVERY DAY AS NEEDED FOR ANXIETY   norethindrone-ethinyl estradiol (FEMHRT 1/5) 1-5 MG-MCG TABS tablet Take by mouth daily.   sertraline (ZOLOFT) 25 MG tablet Take 1 tablet (25 mg total) by mouth  daily.   No facility-administered medications prior to visit.    ROS        Objective:     BP 129/73   Pulse 64   Ht '5\' 3"'$  (1.6 m)   Wt 138 lb (62.6 kg)   SpO2 98%   BMI 24.45 kg/m     Physical Exam Vitals and nursing note reviewed.  Constitutional:      Appearance: She is well-developed.  HENT:     Head: Normocephalic and atraumatic.     Right Ear: Tympanic membrane, ear canal and external ear normal.     Left Ear: Tympanic membrane, ear canal and external ear normal.     Nose: Nose normal.     Mouth/Throat:     Pharynx: Oropharynx is clear.  Eyes:     Conjunctiva/sclera: Conjunctivae normal.     Pupils: Pupils are equal, round, and reactive to light.  Neck:     Thyroid: No thyromegaly.  Cardiovascular:     Rate and Rhythm: Normal rate and regular rhythm.     Heart sounds: Normal heart sounds.  Pulmonary:     Effort: Pulmonary effort is normal.     Breath sounds:  Normal breath sounds. No wheezing.  Abdominal:     General: Bowel sounds are normal.     Palpations: Abdomen is soft.  Musculoskeletal:     Cervical back: Neck supple.  Lymphadenopathy:     Cervical: No cervical adenopathy.  Skin:    General: Skin is warm and dry.  Neurological:     Mental Status: She is alert and oriented to person, place, and time.  Psychiatric:        Behavior: Behavior normal.      No results found for any visits on 01/24/22.      Assessment & Plan:    Routine Health Maintenance and Physical Exam  Keep up a regular exercise program and make sure you are eating a healthy diet Try to eat 4 servings of dairy a day, or if you are lactose intolerant take a calcium with vitamin D daily.  Your vaccines are up to date.    Immunization History  Administered Date(s) Administered   Influenza,inj,Quad PF,6+ Mos 10/04/2014, 09/26/2017, 11/10/2020, 10/28/2021   Influenza,inj,quad, With Preservative 09/28/2013   Influenza-Unspecified 09/04/2014   PFIZER(Purple  Top)SARS-COV-2 Vaccination 01/06/2019, 01/27/2019, 06/06/2019, 06/27/2019   Tdap 04/19/2012   Zoster Recombinat (Shingrix) 10/10/2016, 01/10/2017    Health Maintenance  Topic Date Due   COVID-19 Vaccine (5 - 2023-24 season) 11/14/2022 (Originally 09/03/2021)   DTaP/Tdap/Td (2 - Td or Tdap) 04/20/2022   MAMMOGRAM  02/23/2023   COLONOSCOPY (Pts 45-6yr Insurance coverage will need to be confirmed)  09/24/2023   PAP SMEAR-Modifier  01/18/2027   INFLUENZA VACCINE  Completed   Hepatitis C Screening  Completed   HIV Screening  Completed   Zoster Vaccines- Shingrix  Completed   Pneumococcal Vaccine [id number] old  Aged Out   HPV VACCINES  Aged Out    Discussed health benefits of physical activity, and encouraged her to engage in regular exercise appropriate for her age and condition.  Up-to-date over the summer.  But will get an repeat CMP today and follow-up on abnormal calcium level.  She is planning on getting back into the gym which is fantastic.  Vaccines are up-to-date.  Did encourage her to consider getting the updated COVID-vaccine before traveling this spring.  Problem List Items Addressed This Visit   None Visit Diagnoses     Wellness examination    -  Primary   Relevant Orders   COMPLETE METABOLIC PANEL WITH GFR   Hypocalcemia       Relevant Orders   COMPLETE METABOLIC PANEL WITH GFR      Return in about 6 months (around 07/25/2022) for Mood medication .     CBeatrice Lecher MD

## 2022-01-25 LAB — COMPLETE METABOLIC PANEL WITH GFR
AG Ratio: 1.8 (calc) (ref 1.0–2.5)
ALT: 16 U/L (ref 6–29)
AST: 19 U/L (ref 10–35)
Albumin: 4.3 g/dL (ref 3.6–5.1)
Alkaline phosphatase (APISO): 60 U/L (ref 37–153)
BUN: 15 mg/dL (ref 7–25)
CO2: 28 mmol/L (ref 20–32)
Calcium: 9.1 mg/dL (ref 8.6–10.4)
Chloride: 105 mmol/L (ref 98–110)
Creat: 0.7 mg/dL (ref 0.50–1.05)
Globulin: 2.4 g/dL (calc) (ref 1.9–3.7)
Glucose, Bld: 86 mg/dL (ref 65–99)
Potassium: 4.3 mmol/L (ref 3.5–5.3)
Sodium: 142 mmol/L (ref 135–146)
Total Bilirubin: 0.5 mg/dL (ref 0.2–1.2)
Total Protein: 6.7 g/dL (ref 6.1–8.1)
eGFR: 98 mL/min/{1.73_m2} (ref >=60)

## 2022-01-25 NOTE — Progress Notes (Signed)
Your lab work is within acceptable range and there are no concerning findings.   ?

## 2022-03-16 DIAGNOSIS — R92323 Mammographic fibroglandular density, bilateral breasts: Secondary | ICD-10-CM | POA: Diagnosis not present

## 2022-03-16 DIAGNOSIS — Z1231 Encounter for screening mammogram for malignant neoplasm of breast: Secondary | ICD-10-CM | POA: Diagnosis not present

## 2022-03-16 LAB — HM MAMMOGRAPHY

## 2022-03-23 ENCOUNTER — Encounter: Payer: Self-pay | Admitting: Family Medicine

## 2022-04-25 ENCOUNTER — Other Ambulatory Visit: Payer: Self-pay | Admitting: Family Medicine

## 2022-06-05 ENCOUNTER — Other Ambulatory Visit: Payer: Self-pay | Admitting: Family Medicine

## 2022-06-06 DIAGNOSIS — D485 Neoplasm of uncertain behavior of skin: Secondary | ICD-10-CM | POA: Diagnosis not present

## 2022-06-06 DIAGNOSIS — L570 Actinic keratosis: Secondary | ICD-10-CM | POA: Diagnosis not present

## 2022-06-06 DIAGNOSIS — L218 Other seborrheic dermatitis: Secondary | ICD-10-CM | POA: Diagnosis not present

## 2022-07-13 DIAGNOSIS — M25512 Pain in left shoulder: Secondary | ICD-10-CM | POA: Diagnosis not present

## 2022-07-13 DIAGNOSIS — M7552 Bursitis of left shoulder: Secondary | ICD-10-CM | POA: Diagnosis not present

## 2022-11-19 ENCOUNTER — Other Ambulatory Visit: Payer: Self-pay | Admitting: Family Medicine

## 2022-11-21 NOTE — Telephone Encounter (Signed)
Please call pt: need to schedule f/u app.  Hasn't been seen since Jan  Did refill med for 90 days so ok to schedule next month

## 2022-12-19 ENCOUNTER — Encounter: Payer: Self-pay | Admitting: Family Medicine

## 2022-12-19 ENCOUNTER — Ambulatory Visit (INDEPENDENT_AMBULATORY_CARE_PROVIDER_SITE_OTHER): Payer: BC Managed Care – PPO | Admitting: Family Medicine

## 2022-12-19 VITALS — BP 117/64 | HR 65 | Ht 63.5 in | Wt 141.8 lb

## 2022-12-19 DIAGNOSIS — K591 Functional diarrhea: Secondary | ICD-10-CM | POA: Diagnosis not present

## 2022-12-19 DIAGNOSIS — F419 Anxiety disorder, unspecified: Secondary | ICD-10-CM | POA: Diagnosis not present

## 2022-12-19 DIAGNOSIS — F320 Major depressive disorder, single episode, mild: Secondary | ICD-10-CM

## 2022-12-19 DIAGNOSIS — R233 Spontaneous ecchymoses: Secondary | ICD-10-CM | POA: Insufficient documentation

## 2022-12-19 DIAGNOSIS — R197 Diarrhea, unspecified: Secondary | ICD-10-CM | POA: Insufficient documentation

## 2022-12-19 DIAGNOSIS — Z820 Family history of epilepsy and other diseases of the nervous system: Secondary | ICD-10-CM

## 2022-12-19 MED ORDER — SERTRALINE HCL 50 MG PO TABS: 50.0000 mg | ORAL_TABLET | Freq: Every day | ORAL | 1 refills | Status: DC

## 2022-12-19 NOTE — Assessment & Plan Note (Signed)
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

## 2022-12-19 NOTE — Assessment & Plan Note (Signed)
Is not had a refill on Lomotil and probably a couple years but would like to have at least a short supply to use as needed.

## 2022-12-19 NOTE — Assessment & Plan Note (Signed)
On alprazolam to use as needed.

## 2022-12-19 NOTE — Assessment & Plan Note (Signed)
We discussed options.  Still having some significant anxiety symptoms with a GAD-7 score of 13.  Will try increasing sertraline to 50 mg.  Call if any problems or concerns otherwise plan to follow-up in 2 to 3 months she actually has an appointment coming up in January so we can touch base then.  Just 1 to make sure it is helpful for the anxiety but not for it making her feel too flat emotionally.

## 2022-12-19 NOTE — Progress Notes (Signed)
Established Patient Office Visit  Subjective  Patient ID: Kaitlyn Knox, female    DOB: [birth year]  Age: 62 y.o. MRN: [id number]  Chief Complaint  Patient presents with   mood follow up visit    HPI  Follow-up depression/anxiety-currently on sertraline 25 mg daily and uses alprazolam as needed.  She does feel like the medications helpful and has not noticed any side effects of 25 mg last prescription sent in April.  Her biggest stressors right now are her son who unfortunately has been battling addiction on and off and her mother who has dementia seems to be progressing fairly quickly.  Also has lung cancer and it seems to have grown a little bit.  But no brain mets as far she is aware of.  Still worried about possibility of Alzheimer's.  She has had 2 brothers/siblings diagnosed with Alzheimer's prematurely and both parents were diagnosed and later age.  She has noticed that her skin tears and bruises a lot more easily she says she knows it is probably age but just wanted to make sure there was not something else going on she is planning on getting some labs done in January on the time of her physical.    ROS    Objective:     BP 117/64 (BP Location: Left Arm, Patient Position: Sitting, Cuff Size: Normal)   Pulse 65   Ht 5' 3.5" (1.613 m)   Wt 141 lb 12 oz (64.3 kg)   SpO2 100%   BMI 24.72 kg/m    Physical Exam Vitals and nursing note reviewed.  Constitutional:      Appearance: Normal appearance.  HENT:     Head: Normocephalic and atraumatic.  Eyes:     Conjunctiva/sclera: Conjunctivae normal.  Cardiovascular:     Rate and Rhythm: Normal rate and regular rhythm.  Pulmonary:     Effort: Pulmonary effort is normal.     Breath sounds: Normal breath sounds.  Skin:    General: Skin is warm and dry.  Neurological:     Mental Status: She is alert.  Psychiatric:        Mood and Affect: Mood normal.      No results found for any visits on 12/19/22.    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      Diarrhea   Is not had a refill on Lomotil and probably a couple years but would like to have at least a short supply to use as needed.      Relevant Medications   diphenoxylate-atropine (LOMOTIL) 2.5-0.025 MG tablet   Depression, major, single episode, mild (HCC)   We discussed options.  Still having some significant anxiety symptoms with a GAD-7 score of 13.  Will try increasing sertraline to 50 mg.  Call if any problems or concerns otherwise plan to follow-up in 2 to 3 months she actually has an appointment coming up in January so we can touch base then.  Just 1 to make sure it is helpful for the anxiety but not for it making her feel too flat emotionally.      Relevant Medications  sertraline (ZOLOFT) 50 MG tablet   Anxiety - Primary   On alprazolam to use as needed.      Relevant Medications   sertraline (ZOLOFT) 50 MG tablet    Return in about 3 months (around 03/19/2023) for Mood.    Nani Gasser, MD

## 2022-12-20 MED ORDER — DIPHENOXYLATE-ATROPINE 2.5-0.025 MG PO TABS: 1.0000 | ORAL_TABLET | Freq: Four times a day (QID) | ORAL | 0 refills | Status: DC | PRN

## 2022-12-21 ENCOUNTER — Telehealth: Payer: Self-pay | Admitting: Family Medicine

## 2022-12-21 DIAGNOSIS — Z820 Family history of epilepsy and other diseases of the nervous system: Secondary | ICD-10-CM | POA: Insufficient documentation

## 2022-12-21 NOTE — Assessment & Plan Note (Signed)
Discussed genetic testings. Will see what [facility] offers .Discussed managing risk for dementia like controlling BP, eating healthy and exercising

## 2022-12-21 NOTE — Telephone Encounter (Signed)
Call pt: Labs ordered for Alzheimer's panel. She can come any time for labs.

## 2022-12-21 NOTE — Addendum Note (Signed)
Addended by: Nani Gasser D on: 12/21/2022 09:02 PM   Modules accepted: Orders

## 2022-12-22 ENCOUNTER — Encounter: Payer: Self-pay | Admitting: *Deleted

## 2022-12-22 NOTE — Telephone Encounter (Signed)
He can definitely order the CPE labs but she would need to double check with the insurance whether or not we could coded under her physical.  Some insurances have a requirement that it has to be done within a certain timeframe from the physical but she would have to verify with her insurance on that piece of it.  Or we could just wait until closer to February to do everything.

## 2022-12-22 NOTE — Telephone Encounter (Signed)
Pt advised.   She wanted to know if she can have her yearly labs done at the same time as these labs are done. She informed me that her CPE is not until February I told her that I would need to find out if this would be ok to do those earlier and if so she would need to fast for those labs. She voiced understanding and agreed.   Will fwd to pcp for recommendations.

## 2023-01-03 DIAGNOSIS — Z820 Family history of epilepsy and other diseases of the nervous system: Secondary | ICD-10-CM | POA: Diagnosis not present

## 2023-01-14 ENCOUNTER — Encounter: Payer: Self-pay | Admitting: Family Medicine

## 2023-01-17 ENCOUNTER — Other Ambulatory Visit: Payer: Self-pay | Admitting: Family Medicine

## 2023-01-23 ENCOUNTER — Encounter: Payer: Self-pay | Admitting: Family Medicine

## 2023-01-23 DIAGNOSIS — K219 Gastro-esophageal reflux disease without esophagitis: Secondary | ICD-10-CM

## 2023-01-23 DIAGNOSIS — Z0000 Encounter for general adult medical examination without abnormal findings: Secondary | ICD-10-CM

## 2023-01-24 DIAGNOSIS — Z01419 Encounter for gynecological examination (general) (routine) without abnormal findings: Secondary | ICD-10-CM | POA: Diagnosis not present

## 2023-01-24 DIAGNOSIS — Z6825 Body mass index (BMI) 25.0-25.9, adult: Secondary | ICD-10-CM | POA: Diagnosis not present

## 2023-01-27 DIAGNOSIS — R110 Nausea: Secondary | ICD-10-CM | POA: Diagnosis not present

## 2023-01-27 DIAGNOSIS — R1010 Upper abdominal pain, unspecified: Secondary | ICD-10-CM | POA: Diagnosis not present

## 2023-01-27 DIAGNOSIS — K219 Gastro-esophageal reflux disease without esophagitis: Secondary | ICD-10-CM | POA: Diagnosis not present

## 2023-01-28 LAB — EARLY ONSET ALZHEIMER'S PANEL

## 2023-01-28 LAB — P-TAU181: p-tau181: 0.54 pg/mL (ref 0.00–0.97)

## 2023-01-30 NOTE — Telephone Encounter (Signed)
Looks like it is covered in the original labs except for the lipase so we can see if we can add that on.  Please let patient know that we will add that on.

## 2023-01-31 NOTE — Addendum Note (Signed)
Addended by: Chalmers Cater on: 01/31/2023 08:13 AM   Modules accepted: Orders

## 2023-02-01 DIAGNOSIS — Z0000 Encounter for general adult medical examination without abnormal findings: Secondary | ICD-10-CM | POA: Diagnosis not present

## 2023-02-01 DIAGNOSIS — K219 Gastro-esophageal reflux disease without esophagitis: Secondary | ICD-10-CM | POA: Diagnosis not present

## 2023-02-02 LAB — CBC WITH DIFFERENTIAL/PLATELET
Basophils Absolute: 0 10*3/uL (ref 0.0–0.2)
Basos: 1 %
EOS (ABSOLUTE): 0.1 10*3/uL (ref 0.0–0.4)
Eos: 2 %
Hematocrit: 44.6 % (ref 34.0–46.6)
Hemoglobin: 14.6 g/dL (ref 11.1–15.9)
Immature Grans (Abs): 0 10*3/uL (ref 0.0–0.1)
Immature Granulocytes: 0 %
Lymphocytes Absolute: 1.4 10*3/uL (ref 0.7–3.1)
Lymphs: 31 %
MCH: 31.5 pg (ref 26.6–33.0)
MCHC: 32.7 g/dL (ref 31.5–35.7)
MCV: 96 fL (ref 79–97)
Monocytes Absolute: 0.4 10*3/uL (ref 0.1–0.9)
Monocytes: 8 %
Neutrophils Absolute: 2.7 10*3/uL (ref 1.4–7.0)
Neutrophils: 58 %
Platelets: 221 10*3/uL (ref 150–450)
RBC: 4.63 x10E6/uL (ref 3.77–5.28)
RDW: 12.4 % (ref 11.7–15.4)
WBC: 4.6 10*3/uL (ref 3.4–10.8)

## 2023-02-02 LAB — MAGNESIUM: Magnesium: 2 mg/dL (ref 1.6–2.3)

## 2023-02-02 LAB — LIPID PANEL
Chol/HDL Ratio: 3 {ratio} (ref 0.0–4.4)
Cholesterol, Total: 223 mg/dL — ABNORMAL HIGH (ref 100–199)
HDL: 75 mg/dL (ref >39)
LDL Chol Calc (NIH): 132 mg/dL — ABNORMAL HIGH (ref 0–99)
Triglycerides: 91 mg/dL (ref 0–149)
VLDL Cholesterol Cal: 16 mg/dL (ref 5–40)

## 2023-02-02 LAB — VITAMIN D 25 HYDROXY (VIT D DEFICIENCY, FRACTURES): Vit D, 25-Hydroxy: 38.7 ng/mL (ref 30.0–100.0)

## 2023-02-02 LAB — CMP14+EGFR
ALT: 15 [IU]/L (ref 0–32)
AST: 21 [IU]/L (ref 0–40)
Albumin: 4.2 g/dL (ref 3.9–4.9)
Alkaline Phosphatase: 67 [IU]/L (ref 44–121)
BUN/Creatinine Ratio: 15 (ref 12–28)
BUN: 13 mg/dL (ref 8–27)
Bilirubin Total: 0.4 mg/dL (ref 0.0–1.2)
CO2: 21 mmol/L (ref 20–29)
Calcium: 9.4 mg/dL (ref 8.7–10.3)
Chloride: 105 mmol/L (ref 96–106)
Creatinine, Ser: 0.87 mg/dL (ref 0.57–1.00)
Globulin, Total: 2.1 g/dL (ref 1.5–4.5)
Glucose: 90 mg/dL (ref 70–99)
Potassium: 4.3 mmol/L (ref 3.5–5.2)
Sodium: 144 mmol/L (ref 134–144)
Total Protein: 6.3 g/dL (ref 6.0–8.5)
eGFR: 75 mL/min/{1.73_m2} (ref >59)

## 2023-02-02 LAB — LIPASE: Lipase: 46 U/L (ref 14–72)

## 2023-02-03 ENCOUNTER — Encounter: Payer: Self-pay | Admitting: Family Medicine

## 2023-02-03 NOTE — Progress Notes (Signed)
Will Review at appointment next week.  No urgent findings.  Cholesterol is a little elevated and vitamin D is taken a slight dip.

## 2023-02-09 ENCOUNTER — Ambulatory Visit (INDEPENDENT_AMBULATORY_CARE_PROVIDER_SITE_OTHER): Payer: BC Managed Care – PPO | Admitting: Family Medicine

## 2023-02-09 ENCOUNTER — Encounter: Payer: Self-pay | Admitting: Family Medicine

## 2023-02-09 VITALS — BP 128/81 | HR 65 | Ht 63.5 in | Wt 142.0 lb

## 2023-02-09 DIAGNOSIS — Z0000 Encounter for general adult medical examination without abnormal findings: Secondary | ICD-10-CM

## 2023-02-09 DIAGNOSIS — E7800 Pure hypercholesterolemia, unspecified: Secondary | ICD-10-CM | POA: Diagnosis not present

## 2023-02-09 NOTE — Progress Notes (Signed)
 Complete physical exam  Patient: Kaitlyn Knox   DOB: [birth year]   63 y.o. Female  MRN: [id number]  Subjective:    Chief Complaint  Patient presents with   Annual Exam    Kaitlyn Knox is a 63 y.o. female who presents today for a complete physical exam. She reports consuming a general diet.  Works out regularly.   She generally feels well. . She does not have additional problems to discuss today.   She also wanted let me know she was recently taking some clindamycin and it caused significant reflux and GERD symptoms.  To the point she actually ended up seeing her GI doctor after doing a virtual call and they recommended consultation.  She has been on her PPI for couple of weeks and that seems to have resolved the symptoms.  Had scheduled her for an upper endoscopy but said that it could be canceled if she was feeling better.  Also been undergoing some pretty extensive dental work in the last few weeks.  Most recent fall risk assessment:    12/19/2022    3:42 PM  Fall Risk   Falls in the past year? 0  Number falls in past yr: 0  Injury with Fall? 0  Risk for fall due to : No Fall Risks  Follow up Falls evaluation completed     Most recent depression screenings:    12/19/2022    3:42 PM 01/24/2022    8:24 AM  PHQ 2/9 Scores  PHQ - 2 Score 0 2  PHQ- 9 Score 1 9        Patient Care Team: Alvan Dorothyann BIRCH, MD as PCP - General Jadine Nora RAMAN, MD as Referring Physician (Obstetrics and Gynecology)   Outpatient Medications Prior to Visit  Medication Sig   ALPRAZolam  (XANAX ) 0.5 MG tablet TAKE 1 TABLET BY MOUTH EVERY DAY AS NEEDED FOR ANXIETY   diphenoxylate -atropine  (LOMOTIL ) 2.5-0.025 MG tablet Take 1 tablet by mouth 4 (four) times daily as needed for diarrhea or loose stools.   norethindrone-ethinyl estradiol (FEMHRT 1/5) 1-5 MG-MCG TABS tablet Take by mouth daily.   [DISCONTINUED] sertraline  (ZOLOFT ) 50 MG tablet Take 1 tablet (50 mg total) by mouth daily.    No facility-administered medications prior to visit.    ROS        Objective:     BP 128/81   Pulse 65   Ht 5' 3.5 (1.613 m)   Wt 142 lb (64.4 kg)   SpO2 100%   BMI 24.76 kg/m    Physical Exam Vitals and nursing note reviewed.  Constitutional:      Appearance: Normal appearance.  HENT:     Head: Normocephalic and atraumatic.     Right Ear: Tympanic membrane, ear canal and external ear normal.     Left Ear: Tympanic membrane, ear canal and external ear normal.     Nose: Nose normal.     Mouth/Throat:     Pharynx: Oropharynx is clear.  Eyes:     Extraocular Movements: Extraocular movements intact.     Conjunctiva/sclera: Conjunctivae normal.     Pupils: Pupils are equal, round, and reactive to light.  Neck:     Thyroid : No thyromegaly.  Cardiovascular:     Rate and Rhythm: Normal rate and regular rhythm.  Pulmonary:     Effort: Pulmonary effort is normal.     Breath sounds: Normal breath sounds.  Abdominal:     General: Bowel sounds are normal.  Palpations: Abdomen is soft.     Tenderness: There is no abdominal tenderness.  Musculoskeletal:        General: No swelling.     Cervical back: Neck supple.  Skin:    General: Skin is warm and dry.  Neurological:     Mental Status: She is alert and oriented to person, place, and time.  Psychiatric:        Mood and Affect: Mood normal.        Behavior: Behavior normal.      No results found for any visits on 02/09/23.     Assessment & Plan:    Routine Health Maintenance and Physical Exam  Immunization History  Administered Date(s) Administered   Influenza,inj,Quad PF,6+ Mos 10/04/2014, 09/26/2017, 11/10/2020, 10/28/2021   Influenza,inj,quad, With Preservative 09/28/2013   Influenza-Unspecified 09/04/2014   PFIZER(Purple Top)SARS-COV-2 Vaccination 01/06/2019, 01/27/2019, 06/06/2019, 06/27/2019   Tdap 04/19/2012   Zoster Recombinant(Shingrix ) 10/10/2016, 01/10/2017    Health Maintenance  Topic  Date Due   Cervical Cancer Screening (HPV/Pap Cotest)  03/25/2021   COVID-19 Vaccine (5 - 2024-25 season) 09/04/2022   INFLUENZA VACCINE  04/03/2023 (Originally 08/04/2022)   DTaP/Tdap/Td (2 - Td or Tdap) 12/19/2023 (Originally 04/20/2022)   MAMMOGRAM  03/16/2023   Colonoscopy  09/24/2023   Hepatitis C Screening  Completed   HIV Screening  Completed   Zoster Vaccines- Shingrix   Completed   HPV VACCINES  Aged Out    Discussed health benefits of physical activity, and encouraged her to engage in regular exercise appropriate for her age and condition.  Problem List Items Addressed This Visit   None Visit Diagnoses       Wellness examination    -  Primary     Elevated LDL cholesterol level       Relevant Orders   CT CARDIAC SCORING (SELF PAY ONLY)       Keep up a regular exercise program and make sure you are eating a healthy diet Try to eat 4 servings of dairy a day, or if you are lactose intolerant take a calcium  with vitamin D  daily.  Your vaccines are up to date.  Go over her labs together today.  Discussed options we will get calcium  CT score.  The 10-year ASCVD risk score (Arnett DK, et al., 2019) is: 3.7%   Values used to calculate the score:     Age: 4 years     Sex: Female     Is Non-Hispanic African American: No     Diabetic: No     Tobacco smoker: No     Systolic Blood Pressure: 128 mmHg     Is BP treated: No     HDL Cholesterol: 75 mg/dL     Total Cholesterol: 223 mg/dL   Him and he was also a little borderline low so she is gena pick up 25 mcg daily.  No follow-ups on file.     Dorothyann Byars, MD

## 2023-02-09 NOTE — Patient Instructions (Signed)
 Recommend start 25 mcg daily of vitamin D .

## 2023-02-22 ENCOUNTER — Ambulatory Visit (INDEPENDENT_AMBULATORY_CARE_PROVIDER_SITE_OTHER): Payer: Self-pay

## 2023-02-22 DIAGNOSIS — E7800 Pure hypercholesterolemia, unspecified: Secondary | ICD-10-CM

## 2023-02-22 DIAGNOSIS — I2510 Atherosclerotic heart disease of native coronary artery without angina pectoris: Secondary | ICD-10-CM | POA: Diagnosis not present

## 2023-02-23 ENCOUNTER — Encounter: Payer: Self-pay | Admitting: Family Medicine

## 2023-02-23 DIAGNOSIS — R931 Abnormal findings on diagnostic imaging of heart and coronary circulation: Secondary | ICD-10-CM

## 2023-02-23 NOTE — Progress Notes (Signed)
Hi Kaitlyn Knox, your coronary calcium score was 663 with put you in the 98th percentile for age and sex.  This means that you are at very high risk and have significant plaque burden.  Would like to refer you to cardiology to discuss if any additional workup needs to be considered.  Would also highly recommend starting a statin to lower your risk.  Statins for the most part are well-tolerated.  Occasionally people do get some muscle aches.  But it is actually not that common and that something that we can work around.  We can always switch medicines or adjust doses.  But I would like for you to consider starting a statin and then we would need to recheck your blood levels in about 8 to 12 weeks.

## 2023-02-26 MED ORDER — ROSUVASTATIN CALCIUM 10 MG PO TABS: 10.0000 mg | ORAL_TABLET | Freq: Every day | ORAL | 3 refills | Status: AC

## 2023-03-04 ENCOUNTER — Other Ambulatory Visit: Payer: Self-pay

## 2023-03-04 ENCOUNTER — Ambulatory Visit
Admission: RE | Admit: 2023-03-04 | Discharge: 2023-03-04 | Disposition: A | Discharge status: Home / Self Care | Source: Ambulatory Visit | Attending: Internal Medicine | Admitting: Internal Medicine

## 2023-03-04 VITALS — BP 148/85 | HR 64 | Temp 98.0°F | Resp 16

## 2023-03-04 DIAGNOSIS — H9202 Otalgia, left ear: Secondary | ICD-10-CM

## 2023-03-04 HISTORY — DX: Pure hypercholesterolemia, unspecified: E78.00

## 2023-03-04 MED ORDER — DEXAMETHASONE SODIUM PHOSPHATE 10 MG/ML IJ SOLN
10.0000 mg | Freq: Once | INTRAMUSCULAR | Status: AC
  Administered 2023-03-04: 10 mg via INTRAMUSCULAR

## 2023-03-04 MED ORDER — KETOROLAC TROMETHAMINE 30 MG/ML IJ SOLN
30.0000 mg | Freq: Once | INTRAMUSCULAR | Status: AC
  Administered 2023-03-04: 30 mg via INTRAMUSCULAR

## 2023-03-04 NOTE — ED Provider Notes (Signed)
 [facility]    CSN: [id number] Arrival date & time: 03/04/23  0808      History   Chief Complaint Chief Complaint  Patient presents with   Ear Fullness    I had a sore throat and ear pain starting last weekend and then on Tuesday I had 2 teeth cut out and the pain has gotten worse . - Entered by patient    HPI Kaitlyn Knox is a 63 y.o. female.   63 year old female who presents urgent care with complaints of left ear pain and sore throat.  She reports that she began having earaches, sore throat and sinus drainage last weekend which she reports is normal for this time of year.  She then had a dental procedure in which she had 2 extractions done on Tuesday.  She was started on clindamycin after this.  Her ear pain and throat pain worsened significantly.  She has been trying to take ibuprofen but it is not helping.  She was given hydrocodone after her procedure but is reluctant to use this.  She denies any fevers or chills.  She contacted a virtual physician yesterday and was told to start Vantin to help cover for possible ear infection.  She contacted her surgeon after this as well and he advised that she could stop the clindamycin and continue the Vantin.  She presents today for help with pain control.   Ear Fullness Pertinent negatives include no chest pain, no abdominal pain and no shortness of breath.    No past medical history on file.  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      Home Medications    Prior to Admission medications   Medication Sig Start Date End Date Taking? Authorizing Provider  ALPRAZolam Prudy Feeler) 0.5 MG tablet TAKE 1 TABLET BY MOUTH EVERY DAY AS NEEDED FOR ANXIETY 01/18/23   Agapito Games, MD  diphenoxylate-atropine (LOMOTIL) 2.5-0.025 MG tablet Take 1 tablet by mouth 4 (four) times daily as needed for diarrhea or loose stools. 12/20/22   Agapito Games, MD  norethindrone-ethinyl estradiol (FEMHRT 1/5) 1-5 MG-MCG TABS tablet Take by mouth daily.    [provider]  rosuvastatin (CRESTOR) 10 MG tablet Take 1 tablet (10 mg total) by mouth at bedtime. 02/26/23   Agapito Games, MD    Family History No family history on file.  Social History Social History   Tobacco Use   Smoking status: Former    Current packs/day: 0.00    Types: Cigarettes    Quit date: 08/17/2012    Years since quitting: 10.5   Smokeless tobacco: Never     Allergies   Ciprofloxacin, Penicillins, and Sulfamethoxazole-trimethoprim   Review of Systems Review of Systems  Constitutional:  Negative for chills and fever.  HENT:  Positive for ear pain and sore throat.  Ear fullness  Eyes:  Negative for pain and visual disturbance.  Respiratory:  Negative for cough and shortness of breath.   Cardiovascular:  Negative for chest pain and palpitations.  Gastrointestinal:  Negative for abdominal pain and vomiting.  Genitourinary:  Negative for dysuria and hematuria.  Musculoskeletal:  Negative for arthralgias and back pain.  Skin:  Negative for color change and rash.  Neurological:  Negative for seizures and syncope.  All other systems reviewed and are negative.    Physical Exam Triage Vital Signs ED Triage Vitals  Encounter Vitals Group     BP      Systolic BP Percentile      Diastolic BP Percentile      Pulse      Resp      Temp      Temp src      SpO2       Weight      Height      Head Circumference      Peak Flow      Pain Score      Pain Loc      Pain Education      Exclude from Growth Chart    No data found.  Updated Vital Signs There were no vitals taken for this visit.  Visual Acuity Right Eye Distance:   Left Eye Distance:   Bilateral Distance:    Right Eye Near:   Left Eye Near:    Bilateral Near:     Physical Exam Vitals and nursing note reviewed.  Constitutional:      General: She is not in acute distress.    Appearance: She is well-developed.  HENT:     Head: Normocephalic and atraumatic.     Right Ear: Tympanic membrane normal.     Ears:     Comments: Left tympanic membrane is not erythematous but is slightly cloudy Eyes:     Conjunctiva/sclera: Conjunctivae normal.  Cardiovascular:     Rate and Rhythm: Normal rate and regular rhythm.     Heart sounds: No murmur heard. Pulmonary:     Effort: Pulmonary effort is normal. No respiratory distress.     Breath sounds: Normal breath sounds.  Abdominal:     Palpations: Abdomen is soft.     Tenderness: There is no abdominal tenderness.  Musculoskeletal:        General: No swelling.     Cervical back: Neck supple.  Skin:    General: Skin is warm and dry.     Capillary Refill: Capillary refill takes less than 2 seconds.  Neurological:     Mental Status: She is alert.  Psychiatric:        Mood and Affect: Mood normal.      UC Treatments / Results  Labs (all labs ordered are listed, but only abnormal results are displayed) Labs Reviewed - No data to display  EKG   Radiology No results found.  Procedures Procedures (including critical care time)  Medications Ordered in UC Medications - No data to display  Initial Impression / Assessment and Plan / UC Course  I have reviewed the triage vital signs and the nursing notes.  Pertinent labs & imaging results that were available during my care of the patient were reviewed by me and considered in my  medical decision making (see chart for details).     Left ear pain   Left ear has some cloudiness but no significant redness.  Symptoms are most likely referred  pain from the dental procedure that was done on Tuesday although a low-grade infection may be present.  The antibiotics that you have been changed to would cover your ear as well as dental therefore no changes are needed from the standpoint.  We will treat with the following: Toradol injection given today. This is a medication to help with pain. This is not a narcotic.  Decadron injection given today. This is a steroid to help with inflammation and pain. Continue Vantin as prescribed. Return to urgent care or PCP if symptoms worsen or fail to resolve.    Final Clinical Impressions(s) / UC Diagnoses   Final diagnoses:  None   Discharge Instructions   None    ED Prescriptions   None    PDMP not reviewed this encounter.   Landis Martins, [place] 03/04/23 [phone]

## 2023-03-04 NOTE — Discharge Instructions (Addendum)
 Left ear has some cloudiness but no significant redness.  Symptoms are most likely referred pain from the dental procedure that was done on Tuesday although a low-grade infection may be present.  The antibiotics that you have been changed to would cover your ear as well as dental therefore no changes are needed from the standpoint.  We will treat with the following: Toradol injection given today. This is a medication to help with pain. This is not a narcotic.  Decadron injection given today. This is a steroid to help with inflammation and pain. Continue Vantin as prescribed. Return to urgent care or PCP if symptoms worsen or fail to resolve.

## 2023-03-04 NOTE — ED Triage Notes (Signed)
 Last weekend started with sore throat, earache. Had 2 teeth removed on left side a few days later. Pain in left ear has gotten worse since then. Has not had fever. Has been on clindamycin. Yesterday did virtual call and was put on cefpodoxime (vantin). So she called the surgeon and was told it was ok to take vantin and stop clindamycin so she stopped clindamycin yesterday. Has had 2 doses of vantin.

## 2023-03-06 ENCOUNTER — Encounter: Payer: Self-pay | Admitting: Family Medicine

## 2023-03-06 DIAGNOSIS — I700 Atherosclerosis of aorta: Secondary | ICD-10-CM | POA: Insufficient documentation

## 2023-03-10 ENCOUNTER — Ambulatory Visit: Payer: BC Managed Care – PPO | Admitting: Cardiovascular Disease

## 2023-03-15 DIAGNOSIS — E785 Hyperlipidemia, unspecified: Secondary | ICD-10-CM | POA: Diagnosis not present

## 2023-03-17 ENCOUNTER — Other Ambulatory Visit: Payer: Self-pay | Admitting: Family Medicine

## 2023-03-20 ENCOUNTER — Ambulatory Visit: Payer: BC Managed Care – PPO | Admitting: Family Medicine

## 2023-06-20 DIAGNOSIS — E785 Hyperlipidemia, unspecified: Secondary | ICD-10-CM | POA: Diagnosis not present

## 2023-06-22 ENCOUNTER — Other Ambulatory Visit: Payer: Self-pay | Admitting: Family Medicine

## 2023-06-29 DIAGNOSIS — Z1231 Encounter for screening mammogram for malignant neoplasm of breast: Secondary | ICD-10-CM | POA: Diagnosis not present

## 2023-06-29 DIAGNOSIS — R92323 Mammographic fibroglandular density, bilateral breasts: Secondary | ICD-10-CM | POA: Diagnosis not present

## 2023-06-29 LAB — HM MAMMOGRAPHY

## 2023-07-01 ENCOUNTER — Encounter: Payer: Self-pay | Admitting: Family Medicine

## 2023-07-10 ENCOUNTER — Encounter: Payer: Self-pay | Admitting: Family Medicine

## 2023-07-10 NOTE — Telephone Encounter (Signed)
 Abstracted

## 2023-07-10 NOTE — Telephone Encounter (Signed)
 Please abstract mammogram results as per below.

## 2023-08-15 ENCOUNTER — Other Ambulatory Visit: Payer: Self-pay | Admitting: Family Medicine

## 2023-08-15 DIAGNOSIS — K591 Functional diarrhea: Secondary | ICD-10-CM

## 2023-09-13 DIAGNOSIS — Z1331 Encounter for screening for depression: Secondary | ICD-10-CM | POA: Diagnosis not present

## 2023-09-13 DIAGNOSIS — E785 Hyperlipidemia, unspecified: Secondary | ICD-10-CM | POA: Diagnosis not present

## 2023-09-13 DIAGNOSIS — R931 Abnormal findings on diagnostic imaging of heart and coronary circulation: Secondary | ICD-10-CM | POA: Diagnosis not present

## 2023-09-22 ENCOUNTER — Other Ambulatory Visit: Payer: Self-pay | Admitting: Family Medicine

## 2023-09-22 NOTE — Telephone Encounter (Signed)
 Alprazolam  Last OV: 2/6/025 Next OV: no appointment scheduled Last RF: 06/23/23

## 2024-01-31 ENCOUNTER — Telehealth: Payer: Self-pay

## 2024-01-31 DIAGNOSIS — Z0000 Encounter for general adult medical examination without abnormal findings: Secondary | ICD-10-CM

## 2024-01-31 NOTE — Telephone Encounter (Signed)
 Lab orders placed. Pt advised.

## 2024-01-31 NOTE — Telephone Encounter (Signed)
 Copied from CRM [id number]. Topic: Clinical - Request for Lab/Test Order >> Jan 31, 2024 11:31 AM Montie POUR wrote: Reason for CRM:  Ms. Woodson would like to come in 02/06/24 at 8 AM to complete her annual labs and there is no orders in her chart for labs. Please call Ms. Danner with questions or if she cannot complete labs on 02/06/24 at 8 AM. Her number is [phone].

## 2024-02-15 ENCOUNTER — Encounter: Admitting: Family Medicine
